# Patient Record
Sex: Male | Born: 1965 | Race: White | Hispanic: No | Smoking: Current every day smoker
Health system: Southern US, Community
[De-identification: ages and names within clinical notes are randomized; demographics above are authoritative.]

## PROBLEM LIST (undated history)

## (undated) DIAGNOSIS — Z8619 Personal history of other infectious and parasitic diseases: Secondary | ICD-10-CM

## (undated) DIAGNOSIS — Z87442 Personal history of urinary calculi: Secondary | ICD-10-CM

## (undated) DIAGNOSIS — J449 Chronic obstructive pulmonary disease, unspecified: Secondary | ICD-10-CM

## (undated) DIAGNOSIS — Z8673 Personal history of transient ischemic attack (TIA), and cerebral infarction without residual deficits: Secondary | ICD-10-CM

## (undated) DIAGNOSIS — N201 Calculus of ureter: Secondary | ICD-10-CM

## (undated) DIAGNOSIS — Z8669 Personal history of other diseases of the nervous system and sense organs: Secondary | ICD-10-CM

## (undated) HISTORY — PX: TOTAL HIP ARTHROPLASTY: SHX124

---

## 2006-09-28 ENCOUNTER — Emergency Department (HOSPITAL_COMMUNITY): Admission: EM | Admit: 2006-09-28 | Discharge: 2006-09-28 | Payer: Self-pay | Admitting: Emergency Medicine

## 2007-08-29 HISTORY — PX: KNEE ARTHROSCOPY: SUR90

## 2010-11-02 ENCOUNTER — Emergency Department (HOSPITAL_COMMUNITY): Payer: Medicaid - Out of State

## 2010-11-02 ENCOUNTER — Emergency Department (HOSPITAL_COMMUNITY)
Admission: EM | Admit: 2010-11-02 | Discharge: 2010-11-03 | Disposition: A | Discharge status: Home / Self Care | Payer: Medicaid - Out of State | Attending: Emergency Medicine | Admitting: Emergency Medicine

## 2010-11-02 DIAGNOSIS — R51 Headache: Secondary | ICD-10-CM | POA: Insufficient documentation

## 2010-11-02 DIAGNOSIS — Z8673 Personal history of transient ischemic attack (TIA), and cerebral infarction without residual deficits: Secondary | ICD-10-CM | POA: Insufficient documentation

## 2010-11-02 DIAGNOSIS — J329 Chronic sinusitis, unspecified: Secondary | ICD-10-CM | POA: Insufficient documentation

## 2010-11-02 DIAGNOSIS — R55 Syncope and collapse: Secondary | ICD-10-CM | POA: Insufficient documentation

## 2010-11-02 LAB — DIFFERENTIAL
Basophils Absolute: 0 10*3/uL (ref 0.0–0.1)
Basophils Relative: 0 % (ref 0–1)
Monocytes Absolute: 0.8 10*3/uL (ref 0.1–1.0)
Monocytes Relative: 6 % (ref 3–12)

## 2010-11-02 LAB — CBC
HCT: 45.1 % (ref 39.0–52.0)
MCHC: 33.5 g/dL (ref 30.0–36.0)
MCV: 89.5 fL (ref 78.0–100.0)
Platelets: 260 10*3/uL (ref 150–400)
RBC: 5.04 MIL/uL (ref 4.22–5.81)
RDW: 12.9 % (ref 11.5–15.5)
WBC: 13.4 10*3/uL — ABNORMAL HIGH (ref 4.0–10.5)

## 2010-11-02 LAB — POCT I-STAT, CHEM 8
BUN: 15 mg/dL (ref 6–23)
Creatinine, Ser: 1.3 mg/dL (ref 0.4–1.5)
Glucose, Bld: 101 mg/dL — ABNORMAL HIGH (ref 70–99)
HCT: 46 % (ref 39.0–52.0)

## 2010-11-03 ENCOUNTER — Encounter (HOSPITAL_COMMUNITY): Payer: Self-pay

## 2016-04-04 ENCOUNTER — Observation Stay (HOSPITAL_COMMUNITY)
Admission: EM | Admit: 2016-04-04 | Discharge: 2016-04-05 | Disposition: A | Discharge status: Home / Self Care | Payer: Medicaid - Out of State | Attending: Urology | Admitting: Urology

## 2016-04-04 ENCOUNTER — Emergency Department (HOSPITAL_COMMUNITY): Payer: Medicaid - Out of State | Admitting: Anesthesiology

## 2016-04-04 ENCOUNTER — Encounter (HOSPITAL_COMMUNITY): Payer: Self-pay | Admitting: Emergency Medicine

## 2016-04-04 ENCOUNTER — Observation Stay (HOSPITAL_COMMUNITY): Payer: Medicaid - Out of State

## 2016-04-04 ENCOUNTER — Emergency Department (HOSPITAL_COMMUNITY): Payer: Medicaid - Out of State

## 2016-04-04 ENCOUNTER — Encounter (HOSPITAL_COMMUNITY)
Admission: EM | Disposition: A | Discharge status: Home / Self Care | Payer: Self-pay | Source: Home / Self Care | Attending: Emergency Medicine

## 2016-04-04 DIAGNOSIS — Z8673 Personal history of transient ischemic attack (TIA), and cerebral infarction without residual deficits: Secondary | ICD-10-CM | POA: Insufficient documentation

## 2016-04-04 DIAGNOSIS — F17210 Nicotine dependence, cigarettes, uncomplicated: Secondary | ICD-10-CM | POA: Insufficient documentation

## 2016-04-04 DIAGNOSIS — N201 Calculus of ureter: Secondary | ICD-10-CM | POA: Diagnosis present

## 2016-04-04 DIAGNOSIS — N132 Hydronephrosis with renal and ureteral calculous obstruction: Secondary | ICD-10-CM | POA: Diagnosis not present

## 2016-04-04 DIAGNOSIS — N390 Urinary tract infection, site not specified: Secondary | ICD-10-CM | POA: Diagnosis not present

## 2016-04-04 DIAGNOSIS — Z96643 Presence of artificial hip joint, bilateral: Secondary | ICD-10-CM | POA: Insufficient documentation

## 2016-04-04 DIAGNOSIS — Z419 Encounter for procedure for purposes other than remedying health state, unspecified: Secondary | ICD-10-CM

## 2016-04-04 HISTORY — PX: CYSTOSCOPY W/ URETERAL STENT PLACEMENT: SHX1429

## 2016-04-04 LAB — CBC WITH DIFFERENTIAL/PLATELET
BASOS ABS: 0 10*3/uL (ref 0.0–0.1)
BASOS PCT: 0 %
EOS ABS: 0.1 10*3/uL (ref 0.0–0.7)
Eosinophils Relative: 1 %
HEMATOCRIT: 44.8 % (ref 39.0–52.0)
HEMOGLOBIN: 15.4 g/dL (ref 13.0–17.0)
Lymphocytes Relative: 20 %
Lymphs Abs: 2.6 10*3/uL (ref 0.7–4.0)
MCH: 30.8 pg (ref 26.0–34.0)
MCHC: 34.4 g/dL (ref 30.0–36.0)
MCV: 89.6 fL (ref 78.0–100.0)
MONOS PCT: 7 %
Monocytes Absolute: 0.9 10*3/uL (ref 0.1–1.0)
NEUTROS ABS: 9.2 10*3/uL — AB (ref 1.7–7.7)
NEUTROS PCT: 72 %
Platelets: 257 10*3/uL (ref 150–400)
RBC: 5 MIL/uL (ref 4.22–5.81)
RDW: 12.8 % (ref 11.5–15.5)
WBC: 12.8 10*3/uL — ABNORMAL HIGH (ref 4.0–10.5)

## 2016-04-04 LAB — URINE MICROSCOPIC-ADD ON

## 2016-04-04 LAB — COMPREHENSIVE METABOLIC PANEL
ALK PHOS: 56 U/L (ref 38–126)
ALT: 27 U/L (ref 17–63)
ANION GAP: 8 (ref 5–15)
AST: 23 U/L (ref 15–41)
Albumin: 4 g/dL (ref 3.5–5.0)
BILIRUBIN TOTAL: 0.4 mg/dL (ref 0.3–1.2)
BUN: 9 mg/dL (ref 6–20)
CALCIUM: 9.2 mg/dL (ref 8.9–10.3)
CO2: 24 mmol/L (ref 22–32)
Chloride: 108 mmol/L (ref 101–111)
Creatinine, Ser: 1 mg/dL (ref 0.61–1.24)
GFR calc non Af Amer: >60 mL/min (ref >60)
Glucose, Bld: 103 mg/dL — ABNORMAL HIGH (ref 65–99)
Potassium: 4 mmol/L (ref 3.5–5.1)
SODIUM: 140 mmol/L (ref 135–145)
TOTAL PROTEIN: 7.1 g/dL (ref 6.5–8.1)

## 2016-04-04 LAB — URINALYSIS, ROUTINE W REFLEX MICROSCOPIC
Glucose, UA: NEGATIVE mg/dL
KETONES UR: 15 mg/dL — AB
NITRITE: POSITIVE — AB
PROTEIN: 100 mg/dL — AB
Specific Gravity, Urine: 1.028 (ref 1.005–1.030)
pH: 5 (ref 5.0–8.0)

## 2016-04-04 LAB — I-STAT CHEM 8, ED
BUN: 11 mg/dL (ref 6–20)
CALCIUM ION: 1.17 mmol/L (ref 1.13–1.30)
Chloride: 105 mmol/L (ref 101–111)
Creatinine, Ser: 1 mg/dL (ref 0.61–1.24)
GLUCOSE: 97 mg/dL (ref 65–99)
HCT: 45 % (ref 39.0–52.0)
HEMOGLOBIN: 15.3 g/dL (ref 13.0–17.0)
Potassium: 4 mmol/L (ref 3.5–5.1)
Sodium: 143 mmol/L (ref 135–145)
TCO2: 25 mmol/L (ref 0–100)

## 2016-04-04 LAB — LIPASE, BLOOD: Lipase: 42 U/L (ref 11–51)

## 2016-04-04 LAB — I-STAT TROPONIN, ED: TROPONIN I, POC: 0 ng/mL (ref 0.00–0.08)

## 2016-04-04 SURGERY — CYSTOSCOPY, WITH RETROGRADE PYELOGRAM AND URETERAL STENT INSERTION
Anesthesia: General | Site: Ureter | Laterality: Right

## 2016-04-04 MED ORDER — SODIUM CHLORIDE 0.9 % IV SOLN
Freq: Once | INTRAVENOUS | Status: AC
  Administered 2016-04-04: 14:00:00 via INTRAVENOUS

## 2016-04-04 MED ORDER — PROPOFOL 10 MG/ML IV BOLUS
INTRAVENOUS | Status: AC
  Filled 2016-04-04: qty 20

## 2016-04-04 MED ORDER — HYDROMORPHONE HCL 1 MG/ML IJ SOLN
1.0000 mg | Freq: Once | INTRAMUSCULAR | Status: AC
  Administered 2016-04-04: 1 mg via INTRAVENOUS
  Filled 2016-04-04: qty 1

## 2016-04-04 MED ORDER — LIDOCAINE HCL (CARDIAC) 20 MG/ML IV SOLN
INTRAVENOUS | Status: DC | PRN
  Administered 2016-04-04: 40 mg via INTRAVENOUS

## 2016-04-04 MED ORDER — SODIUM CHLORIDE 0.9 % IR SOLN
Status: DC | PRN
  Administered 2016-04-04: 4000 mL

## 2016-04-04 MED ORDER — PROMETHAZINE HCL 25 MG/ML IJ SOLN
6.2500 mg | INTRAMUSCULAR | Status: DC | PRN
Start: 2016-04-04 — End: 2016-04-04

## 2016-04-04 MED ORDER — DEXTROSE 5 % IV SOLN
1.0000 g | INTRAVENOUS | Status: DC
  Filled 2016-04-04: qty 10

## 2016-04-04 MED ORDER — MIDAZOLAM HCL 2 MG/2ML IJ SOLN
INTRAMUSCULAR | Status: DC | PRN
Start: 2016-04-04 — End: 2016-04-04
  Administered 2016-04-04: 1 mg via INTRAVENOUS

## 2016-04-04 MED ORDER — ONDANSETRON HCL 4 MG/2ML IJ SOLN
4.0000 mg | Freq: Once | INTRAMUSCULAR | Status: AC
  Administered 2016-04-04: 4 mg via INTRAVENOUS
  Filled 2016-04-04: qty 2

## 2016-04-04 MED ORDER — SODIUM CHLORIDE 0.9 % IV SOLN
INTRAVENOUS | Status: DC
  Administered 2016-04-04: 21:00:00 via INTRAVENOUS

## 2016-04-04 MED ORDER — FENTANYL CITRATE (PF) 250 MCG/5ML IJ SOLN
INTRAMUSCULAR | Status: DC | PRN
  Administered 2016-04-04: 25 ug via INTRAVENOUS

## 2016-04-04 MED ORDER — SENNA 8.6 MG PO TABS
1.0000 | ORAL_TABLET | Freq: Two times a day (BID) | ORAL | Status: DC
  Administered 2016-04-04 – 2016-04-05 (×2): 8.6 mg via ORAL
  Filled 2016-04-04 (×2): qty 1

## 2016-04-04 MED ORDER — DEXTROSE 5 % IV SOLN
1.0000 g | Freq: Once | INTRAVENOUS | Status: AC
  Administered 2016-04-04: 1 g via INTRAVENOUS
  Filled 2016-04-04: qty 10

## 2016-04-04 MED ORDER — LORATADINE 10 MG PO TABS
10.0000 mg | ORAL_TABLET | Freq: Every day | ORAL | Status: DC
  Administered 2016-04-05: 10 mg via ORAL
  Filled 2016-04-04: qty 1

## 2016-04-04 MED ORDER — HYDROMORPHONE HCL 1 MG/ML IJ SOLN
0.5000 mg | INTRAMUSCULAR | Status: DC | PRN
  Administered 2016-04-05 (×3): 1 mg via INTRAVENOUS
  Filled 2016-04-04 (×3): qty 1

## 2016-04-04 MED ORDER — FENTANYL CITRATE (PF) 100 MCG/2ML IJ SOLN: 25.0000 ug | INTRAMUSCULAR | Status: DC | PRN

## 2016-04-04 MED ORDER — MIDAZOLAM HCL 2 MG/2ML IJ SOLN
INTRAMUSCULAR | Status: AC
  Filled 2016-04-04: qty 2

## 2016-04-04 MED ORDER — OXYCODONE-ACETAMINOPHEN 5-325 MG PO TABS
1.0000 | ORAL_TABLET | ORAL | Status: DC | PRN
  Administered 2016-04-04: 2 via ORAL
  Filled 2016-04-04: qty 2

## 2016-04-04 MED ORDER — CEFTRIAXONE SODIUM 1 G IJ SOLR
1.0000 g | Freq: Once | INTRAMUSCULAR | Status: AC
  Administered 2016-04-04: 1 g via INTRAVENOUS
  Filled 2016-04-04: qty 10

## 2016-04-04 MED ORDER — ONDANSETRON HCL 4 MG/2ML IJ SOLN
INTRAMUSCULAR | Status: DC | PRN
  Administered 2016-04-04: 4 mg via INTRAVENOUS

## 2016-04-04 MED ORDER — PROPOFOL 10 MG/ML IV BOLUS
INTRAVENOUS | Status: DC | PRN
  Administered 2016-04-04: 180 mg via INTRAVENOUS

## 2016-04-04 MED ORDER — FENTANYL CITRATE (PF) 250 MCG/5ML IJ SOLN
INTRAMUSCULAR | Status: AC
  Filled 2016-04-04: qty 5

## 2016-04-04 MED ORDER — IOPAMIDOL (ISOVUE-300) INJECTION 61%
INTRAVENOUS | Status: AC
  Administered 2016-04-04: 100 mL
  Filled 2016-04-04: qty 100

## 2016-04-04 MED ORDER — DIATRIZOATE MEGLUMINE 30 % UR SOLN
URETHRAL | Status: DC | PRN
  Administered 2016-04-04: 3 mL via URETHRAL

## 2016-04-04 MED ORDER — 0.9 % SODIUM CHLORIDE (POUR BTL) OPTIME
TOPICAL | Status: DC | PRN
  Administered 2016-04-04: 1000 mL

## 2016-04-04 MED ORDER — LACTATED RINGERS IV SOLN
INTRAVENOUS | Status: DC | PRN
  Administered 2016-04-04: 19:00:00 via INTRAVENOUS

## 2016-04-04 SURGICAL SUPPLY — 13 items
BAG URO CATCHER STRL LF (MISCELLANEOUS) ×2 IMPLANT
BASKET ZERO TIP NITINOL 2.4FR (BASKET) IMPLANT
BSKT STON RTRVL ZERO TP 2.4FR (BASKET)
CATH INTERMIT  6FR 70CM (CATHETERS) ×1 IMPLANT
CLOTH BEACON ORANGE TIMEOUT ST (SAFETY) ×2 IMPLANT
GLOVE BIOGEL M STRL SZ7.5 (GLOVE) ×2 IMPLANT
GOWN STRL REUS W/TWL LRG LVL3 (GOWN DISPOSABLE) ×4 IMPLANT
GUIDEWIRE ANG ZIPWIRE 038X150 (WIRE) IMPLANT
GUIDEWIRE STR DUAL SENSOR (WIRE) ×2 IMPLANT
MANIFOLD NEPTUNE II (INSTRUMENTS) ×2 IMPLANT
PACK CYSTO (CUSTOM PROCEDURE TRAY) ×2 IMPLANT
STENT POLARIS 5FRX26 (STENTS) ×1 IMPLANT
TUBING CONNECTING 10 (TUBING) ×2 IMPLANT

## 2016-04-04 NOTE — Transfer of Care (Signed)
Immediate Anesthesia Transfer of Care Note  Patient: Tyrone Harris  Procedure(s) Performed: Procedure(s): CYSTOSCOPY WITH RETROGRADE PYELOGRAM/URETERAL STENT PLACEMENT (Right)  Patient Location: PACU  Anesthesia Type:General  Level of Consciousness: awake  Airway & Oxygen Therapy: Patient Spontanous Breathing and Patient connected to face mask oxygen  Post-op Assessment: Report given to RN and Post -op Vital signs reviewed and stable  Post vital signs: Reviewed and stable  Last Vitals:  Vitals:   04/04/16 1435 04/04/16 1536  BP: 118/84 125/82  Pulse: 95 76  Resp: 18 21  Temp: 36.5 C 36.7 C    Last Pain:  Vitals:   04/04/16 1726  TempSrc:   PainSc: 5          Complications: No apparent anesthesia complications

## 2016-04-04 NOTE — ED Triage Notes (Signed)
EMS - Patient coming from home with c/o of being pale, diaphoretic, left arm pain x 2 weeks, epigastric pain (ongoing since 11:10am), nausea and dizzy.  Patient given  Aspirin and 0.4mg  Nitro.  Per wife at bedside, patient was sitting on the toilet and had multiple episodes (4-5) of blacking out while sitting on the toilet.  BP 112/78, 81 HR, 94% Room Air.

## 2016-04-04 NOTE — Progress Notes (Signed)
PHARMACY NOTE -  ANTIBIOTIC RENAL DOSE ADJUSTMENT    Request received for Pharmacy to assist with antibiotic renal dose adjustment.   Patient has been initiated on Rocephin for ureteral stent placement in setting of bacteriuria.  Current dosage is appropriate and no renal adjustment is required for Rocephin  Will sign off at this time.  Please reconsult if a change in clinical status warrants re-evaluation of dosage.   Bernadene Personrew Katalaya Beel, PharmD, BCPS Pager: [phone] 04/04/2016, 8:57 PM

## 2016-04-04 NOTE — Progress Notes (Signed)
ED CM noted pt with no pcp and medicaid of [place]  ED CM spoke pt who confirms his wife and children live her in [place] but he continues to live in [place]. States he is is "disabled" and thought he had medicare but it is not listed in EPIC. Cm discussed that [place] in [place] and if he continues to stay in  he may consider [facility]    CM discussed and provided written information to assist pt with determining choice for uninsured accepting pcps, discussed the importance of pcp vs EDP services for f/u care, www.needymeds.org, www.goodrx.com, discounted pharmacies and other [place] resources such as [facility] , [facility], affordable care act, financial assistance, uninsured dental services,  med assist, DSS and  health department  Reviewed resources for [place] uninsured accepting pcps like [facility], family medicine [facility] [address], [facility], [facility], local urgent care centers, [facility], [facility], general medical clinics, family services of the [place], [facility] urgent care plus others, medication resources, [facility] out patient pharmacies and housing Pt voiced understanding and appreciation of resources provided   Provided [facility] contact information Pt is not eligible for [facility] because he is not a resident of [place] per pt   1728 asked ED registration to check to see if pt has medicare coverage

## 2016-04-04 NOTE — ED Provider Notes (Signed)
Seen by Dr. Berneice HeinrichManny who will admit patient to hospital.   Tyrone Nayobert Layana Konkel, MD 04/04/16 1710

## 2016-04-04 NOTE — ED Provider Notes (Signed)
[facility]-EMERGENCY DEPT Provider Note   CSN: [id number] Arrival date & time: 04/04/16  1144  First Provider Contact:  First MD Initiated Contact with Patient 04/04/16 1236        History   Chief Complaint Chief Complaint  Patient presents with  . Dizziness  . Arm Pain    left  . Abdominal Pain  . Nausea    HPI Tyrone Harris is a 50 y.o. male.  HPI Patient reports severe worsening of his right-sided abdominal pain which began today.  He's had some hematuria through the weekend and believes he may pass a kidney stone a month ago but his pain is significantly now worsened.  He denies fevers or chills.  He does report nausea and vomiting.  He had a near syncopal episode while at home given the severity of the pain for which EMS was contacted.  He denies chest pain shortness of breath.  He does report some intermittent left shoulder pain over the past several weeks without associated chest discomfort or pain.  His pain is severe in severity at this time and located to his right abdomen.  He denies flank pain at this time.    Past Medical History:  Diagnosis Date  . Bell's palsy   . Stroke [facility]) 2003    There are no active problems to display for this patient.   Past Surgical History:  Procedure Laterality Date  . KNEE SURGERY Right   . TOTAL HIP ARTHROPLASTY Bilateral        Home Medications    Prior to Admission medications   Not on File    Family History No family history on file.  Social History Social History  Substance Use Topics  . Smoking status: Current Every Day Smoker    Packs/day: 2.00    Types: Cigarettes  . Smokeless tobacco: Never Used  . Alcohol use No     Allergies   Review of patient's allergies indicates no known allergies.   Review of Systems Review of Systems  All other systems reviewed and are negative.    Physical Exam Updated Vital Signs BP 122/89   Pulse 65   Temp 97.8 F (36.6 C) (Oral)   Resp 12   Ht  (1.778 m)    Wt 220 lb (99.8 kg)   SpO2 96%   BMI 31.57 kg/m   Physical Exam  Constitutional: He is oriented to person, place, and time. He appears well-developed and well-nourished.  HENT:  Head: Normocephalic and atraumatic.  Eyes: EOM are normal.  Neck: Normal range of motion.  Cardiovascular: Normal rate, regular rhythm, normal heart sounds and intact distal pulses.   Pulmonary/Chest: Effort normal and breath sounds normal. No respiratory distress.  Abdominal: Soft. He exhibits no distension. There is no tenderness.  Musculoskeletal: Normal range of motion.  Neurological: He is alert and oriented to person, place, and time.  Skin: Skin is warm and dry.  Psychiatric: He has a normal mood and affect. Judgment normal.  Nursing note and vitals reviewed.    ED Treatments / Results  Labs (all labs ordered are listed, but only abnormal results are displayed) Labs Reviewed  CBC WITH DIFFERENTIAL/PLATELET - Abnormal; Notable for the following:       Result Value   WBC 12.8 (*)    Neutro Abs 9.2 (*)    All other components within normal limits  COMPREHENSIVE METABOLIC PANEL - Abnormal; Notable for the following:    Glucose, Bld 103 (*)  All other components within normal limits  URINALYSIS, ROUTINE W REFLEX MICROSCOPIC (NOT AT [facility]) - Abnormal; Notable for the following:    Color, Urine BROWN (*)    APPearance TURBID (*)    Hgb urine dipstick LARGE (*)    Bilirubin Urine LARGE (*)    Ketones, ur 15 (*)    Protein, ur 100 (*)    Nitrite POSITIVE (*)    Leukocytes, UA MODERATE (*)    All other components within normal limits  URINE MICROSCOPIC-ADD ON - Abnormal; Notable for the following:    Squamous Epithelial / LPF 0-5 (*)    Bacteria, UA FEW (*)    All other components within normal limits  URINE CULTURE  LIPASE, BLOOD  I-STAT TROPOININ, ED  I-STAT CHEM 8, ED    EKG  EKG Interpretation  Date/Time:  Tuesday April 04 2016 11:51:20 EDT Ventricular Rate:  74 PR Interval:      QRS Duration: 91 QT Interval:  375 QTC Calculation: 416 R Axis:   78 Text Interpretation:  Sinus rhythm RSR' in V1 or V2, right VCD or RVH No significant change was found Confirmed by Jamila Slatten  MD, Alecia Doi ([id number]) on 04/04/2016 2:41:23 PM       Radiology Ct Abdomen Pelvis W Contrast  Result Date: 04/04/2016 CLINICAL DATA:  Dark urine today. Chest pain and blurry vision with dizziness and abdominal pain on the right side. Testicular pain. EXAM: CT ABDOMEN AND PELVIS WITH CONTRAST TECHNIQUE: Multidetector CT imaging of the abdomen and pelvis was performed using the standard protocol following bolus administration of intravenous contrast. CONTRAST:  ISOVUE-300 IOPAMIDOL (ISOVUE-300) INJECTION 61% COMPARISON:  Chest radiograph of 04/04/2016 FINDINGS: Lower chest: Dependent subsegmental atelectasis in both lower lobes. Hepatobiliary: Diffuse hepatic steatosis.  Gallbladder unremarkable. Pancreas: Unremarkable Spleen: Unremarkable Adrenals/Urinary Tract: 1.2 by 1.5 cm mass of the medial limb left adrenal gland, relative washout 88%, compatible with adrenal adenoma. Mild left hydronephrosis due to a 2 mm left proximal ureteral calculus about at the vertical level of the inferior margin of the kidney, shown on image 81/5. Delayed excretion of the right kidney results. No ureteral dilatation distal to this point. No other stones identified. Distal ureters and bladder obscured by streak artifact from the bilateral hip implants. Stomach/Bowel: Scattered diverticula of the descending colon with mild proximal sigmoid diverticulosis. No active diverticulitis. Vascular/Lymphatic: Aortoiliac atherosclerotic vascular disease. Mildly enlarged peripancreatic lymph node, 1.2 cm in short axis on image 23/2. Reproductive: Prostate gland region obscured by streak artifact from the hip implants. Other: No supplemental non-categorized findings. Musculoskeletal: Bilateral total hip prostheses. 3.2 cm umbilical hernia containing  adipose tissue. Remote superior endplate compression at the L3 vertebral level. Generally mild degenerative disc disease in the lumbar spine. IMPRESSION: 1. Obstructive 2 mm right proximal ureteral calculus with associated mild hydronephrosis and associated delayed excretion of contrast from the right kidney. No other stones observed. 2. Diffuse hepatic steatosis. 3. Small left adrenal adenoma. 4. Mildly enlarged peripancreatic node lymph node at 1.2 cm in diameter, technically nonspecific, although in the absence of other abnormalities this is statistically likely to be benign/reactive. 5. Proximal sigmoid colon diverticulosis. 6. Small umbilical hernia contains adipose tissue. Electronically Signed   By: Gaylyn Rong M.D.   On: 04/04/2016 13:51   Dg Chest Portable 1 View  Result Date: 04/04/2016 CLINICAL DATA:  Upper abdominal pain EXAM: PORTABLE CHEST 1 VIEW COMPARISON:  None. FINDINGS: EKG leads create artifact over the chest. Normal heart size and mediastinal contours. No acute  infiltrate or edema. No effusion or pneumothorax. No acute osseous findings. No evidence of pneumoperitoneum. IMPRESSION: Negative portable chest. Electronically Signed   By: Marnee SpringJonathon  Watts M.D.   On: 04/04/2016 12:57    Procedures Procedures (including critical care time)  Medications Ordered in ED Medications  HYDROmorphone (DILAUDID) injection 1 mg (1 mg Intravenous Given 04/04/16 1243)  ondansetron (ZOFRAN) injection 4 mg (4 mg Intravenous Given 04/04/16 1243)  iopamidol (ISOVUE-300) 61 % injection (100 mLs  Contrast Given 04/04/16 1322)     Initial Impression / Assessment and Plan / ED Course  I have reviewed the triage vital signs and the nursing notes.  Pertinent labs & imaging results that were available during my care of the patient were reviewed by me and considered in my medical decision making (see chart for details).  Clinical Course    2:41 PM I spoke with Dr. Berneice HeinrichManny of urology who [facility] as the patient needs a stent for his right ureteral stone with associated pyelonephritis.  Urine culture sent.  IV Rocephin now.  Patient's pain is improved.  [facility]. Will remain NPO  Final Clinical Impressions(s) / ED Diagnoses   Final diagnoses:  Right ureteral stone  UTI (lower urinary tract infection)    New Prescriptions New Prescriptions   No medications on file     Azalia BilisKevin Londin Antone, MD 04/04/16 1443

## 2016-04-04 NOTE — Anesthesia Preprocedure Evaluation (Signed)
Anesthesia Evaluation  Patient identified by MRN, date of birth, ID band Patient awake    Reviewed: Allergy & Precautions, NPO status , Patient's Chart, lab work & pertinent test results  Airway Mallampati: II  TM Distance: >3 FB Neck ROM: Full    Dental  (+) Dental Advisory Given   Pulmonary Current Smoker,    breath sounds clear to auscultation       Cardiovascular negative cardio ROS   Rhythm:Regular Rate:Normal     Neuro/Psych CVA    GI/Hepatic negative GI ROS, Neg liver ROS,   Endo/Other  negative endocrine ROS  Renal/GU negative Renal ROS     Musculoskeletal   Abdominal   Peds  Hematology negative hematology ROS (+)   Anesthesia Other Findings   Reproductive/Obstetrics                             Lab Results  Component Value Date   WBC 12.8 (H) 04/04/2016   HGB 15.3 04/04/2016   HCT 45.0 04/04/2016   MCV 89.6 04/04/2016   PLT 257 04/04/2016   Lab Results  Component Value Date   CREATININE 1.00 04/04/2016   BUN 11 04/04/2016   NA 143 04/04/2016   K 4.0 04/04/2016   CL 105 04/04/2016   CO2 24 04/04/2016    Anesthesia Physical Anesthesia Plan  ASA: II  Anesthesia Plan: General   Post-op Pain Management:    Induction: Intravenous  Airway Management Planned: LMA  Additional Equipment:   Intra-op Plan:   Post-operative Plan: Extubation in OR  Informed Consent: I have reviewed the patients History and Physical, chart, labs and discussed the procedure including the risks, benefits and alternatives for the proposed anesthesia with the patient or authorized representative who has indicated his/her understanding and acceptance.   Dental advisory given  Plan Discussed with: CRNA  Anesthesia Plan Comments:         Anesthesia Quick Evaluation

## 2016-04-04 NOTE — H&P (Signed)
Tyrone Harris is an 50 y.o. male.    Chief Complaint:  Right Ureteral Stone with Refractory Colic and Bacteruria   HPI:   Right Ureteral Stone with Refractory Colic and Bacteruria - 70m Rt prox ureteral stone by ER CT on eval severe abd pain and malaise / diaphoresis. Stone is solitary and just below UPJ on right. No fevers or leukocytosis but severe malaise and bacteruria / + nitrites.   PMH sig for bilateral hip replacement, knee surgery, remote CVA (minimal deficits), no limiting CV disease, no blood thinners.   Today "DKasandra Knudsen" is seen in consultation for above.   Past Medical History:  Diagnosis Date  . Bell's palsy   . Stroke ([facility] 2003    Past Surgical History:  Procedure Laterality Date  . KNEE SURGERY Right   . TOTAL HIP ARTHROPLASTY Bilateral     No family history on file. Social History:  reports that he has been smoking Cigarettes.  He has been smoking about 2.00 packs per day. He has never used smokeless tobacco. He reports that he uses drugs, including Marijuana, about 1 time per week. He reports that he does not drink alcohol.  Allergies: No Known Allergies   (Not in a hospital admission)  Results for orders placed or performed during the hospital encounter of 04/04/16 (from the past 48 hour(s))  CBC with Differential/Platelet     Status: Abnormal   Collection Time: 04/04/16 12:32 PM  Result Value Ref Range   WBC 12.8 (H) 4.0 - 10.5 K/uL   RBC 5.00 4.22 - 5.81 MIL/uL   Hemoglobin 15.4 13.0 - 17.0 g/dL   HCT 44.8 39.0 - 52.0 %   MCV 89.6 78.0 - 100.0 fL   MCH 30.8 26.0 - 34.0 pg   MCHC 34.4 30.0 - 36.0 g/dL   RDW 12.8 11.5 - 15.5 %   Platelets 257 150 - 400 K/uL   Neutrophils Relative % 72 %   Neutro Abs 9.2 (H) 1.7 - 7.7 K/uL   Lymphocytes Relative 20 %   Lymphs Abs 2.6 0.7 - 4.0 K/uL   Monocytes Relative 7 %   Monocytes Absolute 0.9 0.1 - 1.0 K/uL   Eosinophils Relative 1 %   Eosinophils Absolute 0.1 0.0 - 0.7 K/uL   Basophils Relative 0 %    Basophils Absolute 0.0 0.0 - 0.1 K/uL  Comprehensive metabolic panel     Status: Abnormal   Collection Time: 04/04/16 12:32 PM  Result Value Ref Range   Sodium 140 135 - 145 mmol/L   Potassium 4.0 3.5 - 5.1 mmol/L   Chloride 108 101 - 111 mmol/L   CO2 24 22 - 32 mmol/L   Glucose, Bld 103 (H) 65 - 99 mg/dL   BUN 9 6 - 20 mg/dL   Creatinine, Ser 1.00 0.61 - 1.24 mg/dL   Calcium 9.2 8.9 - 10.3 mg/dL   Total Protein 7.1 6.5 - 8.1 g/dL   Albumin 4.0 3.5 - 5.0 g/dL   AST 23 15 - 41 U/L   ALT 27 17 - 63 U/L   Alkaline Phosphatase 56 38 - 126 U/L   Total Bilirubin 0.4 0.3 - 1.2 mg/dL   GFR calc non Af Amer >60 >60 mL/min   GFR calc Af Amer >60 >60 mL/min    Comment: (NOTE) The eGFR has been calculated using the CKD EPI equation. This calculation has not been validated in all clinical situations. eGFR's persistently <60 mL/min signify possible Chronic Kidney Disease.  Anion gap 8 5 - 15  Lipase, blood     Status: None   Collection Time: 04/04/16 12:32 PM  Result Value Ref Range   Lipase 42 11 - 51 U/L  Urinalysis, Routine w reflex microscopic (not at [facility])     Status: Abnormal   Collection Time: 04/04/16 12:33 PM  Result Value Ref Range   Color, Urine BROWN (A) YELLOW    Comment: BIOCHEMICALS MAY BE AFFECTED BY COLOR   APPearance TURBID (A) CLEAR   Specific Gravity, Urine 1.028 1.005 - 1.030   pH 5.0 5.0 - 8.0   Glucose, UA NEGATIVE NEGATIVE mg/dL   Hgb urine dipstick LARGE (A) NEGATIVE   Bilirubin Urine LARGE (A) NEGATIVE   Ketones, ur 15 (A) NEGATIVE mg/dL   Protein, ur 100 (A) NEGATIVE mg/dL   Nitrite POSITIVE (A) NEGATIVE   Leukocytes, UA MODERATE (A) NEGATIVE  Urine microscopic-add on     Status: Abnormal   Collection Time: 04/04/16 12:33 PM  Result Value Ref Range   Squamous Epithelial / LPF 0-5 (A) NONE SEEN   WBC, UA 6-30 0 - 5 WBC/hpf   RBC / HPF TOO NUMEROUS TO COUNT 0 - 5 RBC/hpf   Bacteria, UA FEW (A) NONE SEEN   Urine-Other MUCOUS PRESENT   I-stat troponin,  ED     Status: None   Collection Time: 04/04/16 12:44 PM  Result Value Ref Range   Troponin i, poc 0.00 0.00 - 0.08 ng/mL   Comment 3            Comment: Due to the release kinetics of cTnI, a negative result within the first hours of the onset of symptoms does not rule out myocardial infarction with certainty. If myocardial infarction is still suspected, repeat the test at appropriate intervals.   I-stat chem 8, ed     Status: None   Collection Time: 04/04/16 12:46 PM  Result Value Ref Range   Sodium 143 135 - 145 mmol/L   Potassium 4.0 3.5 - 5.1 mmol/L   Chloride 105 101 - 111 mmol/L   BUN 11 6 - 20 mg/dL   Creatinine, Ser 1.00 0.61 - 1.24 mg/dL   Glucose, Bld 97 65 - 99 mg/dL   Calcium, Ion 1.17 1.13 - 1.30 mmol/L   TCO2 25 0 - 100 mmol/L   Hemoglobin 15.3 13.0 - 17.0 g/dL   HCT 45.0 39.0 - 52.0 %   Ct Abdomen Pelvis W Contrast  Result Date: 04/04/2016 CLINICAL DATA:  Dark urine today. Chest pain and blurry vision with dizziness and abdominal pain on the right side. Testicular pain. EXAM: CT ABDOMEN AND PELVIS WITH CONTRAST TECHNIQUE: Multidetector CT imaging of the abdomen and pelvis was performed using the standard protocol following bolus administration of intravenous contrast. CONTRAST:  129m ISOVUE-300 IOPAMIDOL (ISOVUE-300) INJECTION 61% COMPARISON:  Chest radiograph of 04/04/2016 FINDINGS: Lower chest: Dependent subsegmental atelectasis in both lower lobes. Hepatobiliary: Diffuse hepatic steatosis.  Gallbladder unremarkable. Pancreas: Unremarkable Spleen: Unremarkable Adrenals/Urinary Tract: 1.2 by 1.5 cm mass of the medial limb left adrenal gland, relative washout 88%, compatible with adrenal adenoma. Mild left hydronephrosis due to a 2 mm left proximal ureteral calculus about at the vertical level of the inferior margin of the kidney, shown on image 81/5. Delayed excretion of the right kidney results. No ureteral dilatation distal to this point. No other stones identified.  Distal ureters and bladder obscured by streak artifact from the bilateral hip implants. Stomach/Bowel: Scattered diverticula of the descending colon with  mild proximal sigmoid diverticulosis. No active diverticulitis. Vascular/Lymphatic: Aortoiliac atherosclerotic vascular disease. Mildly enlarged peripancreatic lymph node, 1.2 cm in short axis on image 23/2. Reproductive: Prostate gland region obscured by streak artifact from the hip implants. Other: No supplemental non-categorized findings. Musculoskeletal: Bilateral total hip prostheses. 3.2 cm umbilical hernia containing adipose tissue. Remote superior endplate compression at the L3 vertebral level. Generally mild degenerative disc disease in the lumbar spine. IMPRESSION: 1. Obstructive 2 mm right proximal ureteral calculus with associated mild hydronephrosis and associated delayed excretion of contrast from the right kidney. No other stones observed. 2. Diffuse hepatic steatosis. 3. Small left adrenal adenoma. 4. Mildly enlarged peripancreatic node lymph node at 1.2 cm in diameter, technically nonspecific, although in the absence of other abnormalities this is statistically likely to be benign/reactive. 5. Proximal sigmoid colon diverticulosis. 6. Small umbilical hernia contains adipose tissue. Electronically Signed   By: Van Clines M.D.   On: 04/04/2016 13:51   Dg Chest Portable 1 View  Result Date: 04/04/2016 CLINICAL DATA:  Upper abdominal pain EXAM: PORTABLE CHEST 1 VIEW COMPARISON:  None. FINDINGS: EKG leads create artifact over the chest. Normal heart size and mediastinal contours. No acute infiltrate or edema. No effusion or pneumothorax. No acute osseous findings. No evidence of pneumoperitoneum. IMPRESSION: Negative portable chest. Electronically Signed   By: Monte Fantasia M.D.   On: 04/04/2016 12:57    Review of Systems  Constitutional: Positive for malaise/fatigue. Negative for fever.  HENT: Negative.   Eyes: Negative.    Respiratory: Negative.   Cardiovascular: Negative.   Gastrointestinal: Negative.   Genitourinary: Negative.   Musculoskeletal: Negative.   Skin: Negative.   Endo/Heme/Allergies: Negative.   Psychiatric/Behavioral: Negative.     Blood pressure 122/89, pulse 65, temperature 97.8 F (36.6 C), temperature source Oral, resp. rate 12, height 5' 10"  (1.778 m), weight 99.8 kg (220 lb), SpO2 96 %. Physical Exam  Constitutional: He is oriented to person, place, and time. He appears well-developed and well-nourished.  Family at bedside  HENT:  Head: Normocephalic.  Eyes: Pupils are equal, round, and reactive to light.  Neck: Normal range of motion.  Cardiovascular: Normal rate.   Respiratory: Effort normal.  GI: Soft.  Genitourinary:  Genitourinary Comments: Mild Rt CVAT  Musculoskeletal: Normal range of motion.  Neurological: He is alert and oriented to person, place, and time.  Skin: Skin is warm.  Psychiatric: He has a normal mood and affect. His behavior is normal. Judgment and thought content normal.     Assessment/Plan  Right Ureteral Stone with Refractory Colic and Bacteruria - discussed risk of impending infected / obsructed system with significant risk of sepsis / SIRS and goal of renal decompression, then clearance of infectious parameters, and then definitive stone management in elective setting.   IN terms of renal decompression, stent would be first choice. Risks, benefits, alternatives (neph tube) discussed, and peri-op course. He wants to proceed today. Will likely observe overnight post-op to r/o progression of infectious parameters.   Alexis Frock, MD 04/04/2016, 2:36 PM

## 2016-04-04 NOTE — Anesthesia Procedure Notes (Signed)
Procedure Name: LMA Insertion Date/Time: 04/04/2016 7:24 PM Performed by: Minerva EndsMIRARCHI, Maeley Matton M Pre-anesthesia Checklist: Patient identified, Emergency Drugs available, Suction available and Patient being monitored Patient Re-evaluated:Patient Re-evaluated prior to inductionOxygen Delivery Method: Circle System Utilized Preoxygenation: Pre-oxygenation with 100% oxygen Intubation Type: IV induction Ventilation: Mask ventilation without difficulty LMA: LMA inserted LMA Size: 4.0 Number of attempts: 1 Placement Confirmation: positive ETCO2,  breath sounds checked- equal and bilateral and CO2 detector Tube secured with: Tape Dental Injury: Teeth and Oropharynx as per pre-operative assessment  Comments: Smooth IV induction--- LMA AM CRNA atraumatic--- teeth and mouth as preop

## 2016-04-04 NOTE — Brief Op Note (Signed)
04/04/2016  7:36 PM  PATIENT:  Tyrone Harris  50 y.o. male  PRE-OPERATIVE DIAGNOSIS:  right ureteral obstruction  POST-OPERATIVE DIAGNOSIS:  right ureteral obstruction  PROCEDURE:  Procedure(s): CYSTOSCOPY WITH RETROGRADE PYELOGRAM/URETERAL STENT PLACEMENT (Right)  SURGEON:  Surgeon(s) and Role:    * Sebastian Acheheodore Viyan Rosamond, MD - Primary  PHYSICIAN ASSISTANT:   ASSISTANTS: none   ANESTHESIA:   general  EBL:  No intake/output data recorded.  BLOOD ADMINISTERED:none  DRAINS: none   LOCAL MEDICATIONS USED:  NONE  SPECIMEN:  No Specimen  DISPOSITION OF SPECIMEN:  N/A  COUNTS:  YES  TOURNIQUET:  * No tourniquets in log *  DICTATION: .Other Dictation: Dictation Number [id number]  PLAN OF CARE: Admit for overnight observation  PATIENT DISPOSITION:  PACU - hemodynamically stable.   Delay start of Pharmacological VTE agent (>24hrs) due to surgical blood loss or risk of bleeding: yes

## 2016-04-04 NOTE — Op Note (Signed)
NAMFelix Harris:  Rundquist, Rene                ACCOUNT NO.:  [id number]  MEDICAL RECORD NO.:  [id number]  LOCATION:  1412                         [facility]  PHYSICIAN:  Sebastian Acheheodore Takeyah Wieman, MD     DATE OF BIRTH:  1966-02-21  DATE OF PROCEDURE: 04/04/2016                              OPERATIVE REPORT  DIAGNOSIS:  Right ureteral stone, significant bacteriuria, mild hydronephrosis.  PROCEDURE: 1. Cystoscopy with right retrograde pyelogram and interpretation. 2. Right ureteral stent placement 5 x 26 Polaris, no tether.  ESTIMATED BLOOD LOSS:  Nil.  COMPLICATION:  None.  SPECIMEN:  None.  FINDINGS: 1. Unremarkable urinary bladder. 2. Very mild hydronephrosis to filling defect in the proximal ureter     consistent with known stone. 3. Successful placement of right ureteral stent, proximal in renal     pelvis, distal in urinary bladder.  INDICATION:  Mr. Tyrone Harris is a pleasant 50 year old gentleman with history of recurrent nephrolithiasis.  He was found on workup of colicky flank pain to have a relatively small right proximal ureteral stone.  However, his symptoms were quite severe.  He also had malaise, diaphoresis, and bacteriuria.  This clinical picture is certainly concerning for impending obstructing urosepsis.  Options were discussed for management including placement of stent with goal of the renal decompression, clearance of infectious parameters for delayed definitive stone treatment, he wished to proceed.  Informed consent was obtained and placed in the medical record.  PROCEDURE IN DETAIL:  The patient being Tyrone Harris and the procedure being right ureteral stent placement was confirmed.  Procedure was carried out.  Time-out was performed.  Intravenous antibiotics were administered.  General LMA anesthesia introduced.  Patient was placed into a low lithotomy position.  Sterile field was created by prepping and draping patient's penis, perineum, and proximal thighs using  iodine x3.  Next, cystourethroscopy was performed using a 21-French rigid cystoscope with offset lens.  Inspection of the anterior and posterior urethra were unremarkable.  Inspection of urinary bladder revealed no diverticula, calcifications, papillary lesions.  Ureteral orifices appeared singleton.  The right ureteral orifice was cannulated with a 6- French end-hole catheter and right retrograde pyelogram was obtained.  Right retrograde pyelogram demonstrated a single right ureter with single-system right kidney.  There was a very small filling defect in proximal ureter consistent with known stone.  There was very mild hydroureteronephrosis above this.  A 0.038 Zip wire was navigated to the upper pole, over which a new 5 x 26 Polaris-type stent was placed, proximal in renal pelvis and distal in urinary bladder.  Efflux urine was seen around into the distal end of the stent.  Bladder was emptied per cystoscope.  Procedure was then terminated.  The patient tolerated procedure well.  There were no immediate periprocedural complications.  The patient was taken to the postanesthesia care unit in stable condition.          ______________________________ Sebastian Acheheodore Jerin Franzel, MD     TM/MEDQ  D:  04/04/2016  T:  04/04/2016  Job:  [id number]

## 2016-04-05 ENCOUNTER — Encounter (HOSPITAL_COMMUNITY): Payer: Self-pay | Admitting: Urology

## 2016-04-05 LAB — URINE CULTURE: Culture: NO GROWTH

## 2016-04-05 MED ORDER — OXYCODONE-ACETAMINOPHEN 5-325 MG PO TABS: 1.0000 | ORAL_TABLET | Freq: Four times a day (QID) | ORAL | 0 refills | Status: DC | PRN

## 2016-04-05 MED ORDER — SENNOSIDES-DOCUSATE SODIUM 8.6-50 MG PO TABS: 1.0000 | ORAL_TABLET | Freq: Two times a day (BID) | ORAL | 0 refills | Status: AC

## 2016-04-05 MED ORDER — CEPHALEXIN 500 MG PO CAPS: 500.0000 mg | ORAL_CAPSULE | Freq: Two times a day (BID) | ORAL | 0 refills | Status: DC

## 2016-04-05 NOTE — Discharge Summary (Signed)
Physician Discharge Summary  Patient ID: Tyrone Harris MRN: [id number] DOB/AGE: 29-Apr-1966 50 y.o.  Admit date: 04/04/2016 Discharge date: 04/05/2016  Admission Diagnoses: Right Ureteral Stone with Bacteruria  Discharge Diagnoses:  Active Problems:   Ureteral stone   Discharged Condition: good  Hospital Course:   Right Ureteral Stone with Bacteruria - 2mm Rt ureteral stone by ER CT 04/04/2016 and signifiacnt malaise and bacteruria on UA. No fevers. Underwent urgent Rt ureteral stent for renal decompression 8/8 and observed overnight on empiric rocephin. By POD 1, the day of discharge, he is ambualtory, pain controlled with PO meds, fever free, and felt to be adequate for discharge. He will continue ABX course with 5 more days of keflex and plan for second stage surgery for actual stone removal in few weeks.   Consults: None  Significant Diagnostic Studies: labs: UCX - pending  Treatments: surgery:  Rt ureteral stent for renal decompression 04/04/2016  Discharge Exam: Blood pressure 116/71, pulse 60, temperature 97.9 F (36.6 C), temperature source Oral, resp. rate 18, height 5\' 10"  (1.778 m), weight 95.5 kg (210 lb 8.6 oz), SpO2 97 %. General appearance: alert, cooperative and appears stated age Eyes: negative Nose: Nares normal. Septum midline. Mucosa normal. No drainage or sinus tenderness. Throat: lips, mucosa, and tongue normal; teeth and gums normal Neck: supple, symmetrical, trachea midline Back: symmetric, no curvature. ROM normal. No CVA tenderness. Resp: non-labored on room air.  Cardio: regular rate and rhythm, S1, S2 normal, no murmur, click, rub or gallop GI: soft, non-tender; bowel sounds normal; no masses,  no organomegaly Male genitalia: normal Extremities: extremities normal, atraumatic, no cyanosis or edema Lymph nodes: Cervical, supraclavicular, and axillary nodes normal. Neurologic: Grossly normal  Disposition: 01-Home or Self Care     Medication List     TAKE these medications   bismuth subsalicylate 262 MG/15ML suspension Commonly known as:  PEPTO BISMOL Take 30 mLs by mouth every 6 (six) hours as needed for indigestion or diarrhea or loose stools.   cephALEXin 500 MG capsule Commonly known as:  KEFLEX Take 1 capsule (500 mg total) by mouth 2 (two) times daily. X 5 days now. Also begin day prior to next Urology surgery.   loratadine 10 MG tablet Commonly known as:  CLARITIN Take 10 mg by mouth daily.   oxyCODONE-acetaminophen 5-325 MG tablet Commonly known as:  ROXICET Take 1-2 tablets by mouth every 6 (six) hours as needed for severe pain (post-operatively).   senna-docusate 8.6-50 MG tablet Commonly known as:  Senokot-S Take 1 tablet by mouth 2 (two) times daily. While taking strong pain meds to prevent constipation.      Follow-up Information    Sebastian AcheMANNY, Elina Streng, MD .   Specialty:  Urology Why:  We will call to schedule next surgery for kidney stone.  Contact information: [address] [place] [id number] [phone]           Signed: Sebastian AcheMANNY, Bennet Kujawa 04/05/2016, 9:24 AM

## 2016-04-05 NOTE — Discharge Instructions (Signed)
1 - You may have urinary urgency (bladder spasms) and bloody urine on / off with stent in place. This is normal. ° °2 - Call MD or go to ER for fever >102, severe pain / nausea / vomiting not relieved by medications, or acute change in medical status ° °

## 2016-04-05 NOTE — Progress Notes (Signed)
Pt was informed [facility] and he was also provided with coupon from [email or web address] the Roxicet. Pt appreciative of CM assistance. Sandford CrazeNora Leveon Pelzer RN,BSN,NCM

## 2016-04-06 ENCOUNTER — Other Ambulatory Visit: Payer: Self-pay | Admitting: Urology

## 2016-04-06 NOTE — Anesthesia Postprocedure Evaluation (Signed)
Anesthesia Post Note  Patient: Tyrone LanceDaniel L Harris  Procedure(s) Performed: Procedure(s) (LRB): CYSTOSCOPY WITH RETROGRADE PYELOGRAM/URETERAL STENT PLACEMENT (Right)  Patient location during evaluation: PACU Anesthesia Type: General Level of consciousness: awake and alert Pain management: pain level controlled Vital Signs Assessment: post-procedure vital signs reviewed and stable Respiratory status: spontaneous breathing, nonlabored ventilation, respiratory function stable and patient connected to nasal cannula oxygen Cardiovascular status: blood pressure returned to baseline and stable Postop Assessment: no signs of nausea or vomiting Anesthetic complications: no    Last Vitals:  Vitals:   04/05/16 0210 04/05/16 0547  BP: 112/75 116/71  Pulse: 65 60  Resp: 16 18  Temp: 36.6 C 36.6 C    Last Pain:  Vitals:   04/05/16 0859  TempSrc:   PainSc: 8                  Kennieth RadFitzgerald, Jalei Shibley E

## 2016-04-07 ENCOUNTER — Encounter (HOSPITAL_BASED_OUTPATIENT_CLINIC_OR_DEPARTMENT_OTHER): Payer: Self-pay | Admitting: *Deleted

## 2016-04-07 NOTE — Progress Notes (Signed)
NPO AFTER MN.  ARRIVE AT [phone].  CURRENT LAB RESULTS AND EKG IN CHART AND EPIC.  WILL TAKE CLARITIN AND IF NEEDED OXYCODONE AM DOS W/ SIPS OF WATER.

## 2016-04-12 ENCOUNTER — Ambulatory Visit (HOSPITAL_BASED_OUTPATIENT_CLINIC_OR_DEPARTMENT_OTHER)
Admission: RE | Admit: 2016-04-12 | Discharge: 2016-04-12 | Disposition: A | Discharge status: Home / Self Care | Payer: Medicaid - Out of State | Source: Ambulatory Visit | Attending: Urology | Admitting: Urology

## 2016-04-12 ENCOUNTER — Encounter (HOSPITAL_BASED_OUTPATIENT_CLINIC_OR_DEPARTMENT_OTHER): Payer: Self-pay | Admitting: *Deleted

## 2016-04-12 ENCOUNTER — Encounter (HOSPITAL_BASED_OUTPATIENT_CLINIC_OR_DEPARTMENT_OTHER)
Admission: RE | Disposition: A | Discharge status: Home / Self Care | Payer: Self-pay | Source: Ambulatory Visit | Attending: Urology

## 2016-04-12 ENCOUNTER — Ambulatory Visit (HOSPITAL_BASED_OUTPATIENT_CLINIC_OR_DEPARTMENT_OTHER): Payer: Medicaid - Out of State | Admitting: Anesthesiology

## 2016-04-12 DIAGNOSIS — J449 Chronic obstructive pulmonary disease, unspecified: Secondary | ICD-10-CM | POA: Insufficient documentation

## 2016-04-12 DIAGNOSIS — F17210 Nicotine dependence, cigarettes, uncomplicated: Secondary | ICD-10-CM | POA: Diagnosis not present

## 2016-04-12 DIAGNOSIS — M1990 Unspecified osteoarthritis, unspecified site: Secondary | ICD-10-CM | POA: Diagnosis not present

## 2016-04-12 DIAGNOSIS — N132 Hydronephrosis with renal and ureteral calculous obstruction: Secondary | ICD-10-CM | POA: Insufficient documentation

## 2016-04-12 DIAGNOSIS — Z8673 Personal history of transient ischemic attack (TIA), and cerebral infarction without residual deficits: Secondary | ICD-10-CM | POA: Diagnosis not present

## 2016-04-12 DIAGNOSIS — Z96643 Presence of artificial hip joint, bilateral: Secondary | ICD-10-CM | POA: Insufficient documentation

## 2016-04-12 DIAGNOSIS — N201 Calculus of ureter: Secondary | ICD-10-CM | POA: Diagnosis present

## 2016-04-12 HISTORY — PX: STONE EXTRACTION WITH BASKET: SHX5318

## 2016-04-12 HISTORY — DX: Personal history of other diseases of the nervous system and sense organs: Z86.69

## 2016-04-12 HISTORY — DX: Chronic obstructive pulmonary disease, unspecified: J44.9

## 2016-04-12 HISTORY — DX: Personal history of urinary calculi: Z87.442

## 2016-04-12 HISTORY — DX: Calculus of ureter: N20.1

## 2016-04-12 HISTORY — DX: Personal history of transient ischemic attack (TIA), and cerebral infarction without residual deficits: Z86.73

## 2016-04-12 HISTORY — PX: CYSTOSCOPY WITH RETROGRADE PYELOGRAM, URETEROSCOPY AND STENT PLACEMENT: SHX5789

## 2016-04-12 SURGERY — CYSTOURETEROSCOPY, WITH RETROGRADE PYELOGRAM AND STENT INSERTION
Anesthesia: General | Site: Renal | Laterality: Right

## 2016-04-12 MED ORDER — MIDAZOLAM HCL 5 MG/5ML IJ SOLN
INTRAMUSCULAR | Status: DC | PRN
  Administered 2016-04-12: 2 mg via INTRAVENOUS

## 2016-04-12 MED ORDER — OXYCODONE-ACETAMINOPHEN 5-325 MG PO TABS
ORAL_TABLET | ORAL | Status: AC
  Filled 2016-04-12: qty 1

## 2016-04-12 MED ORDER — OXYCODONE-ACETAMINOPHEN 5-325 MG PO TABS: 1.0000 | ORAL_TABLET | Freq: Four times a day (QID) | ORAL | 0 refills | Status: DC | PRN

## 2016-04-12 MED ORDER — IOHEXOL 300 MG/ML  SOLN
INTRAMUSCULAR | Status: DC | PRN
  Administered 2016-04-12: 6 mL

## 2016-04-12 MED ORDER — SODIUM CHLORIDE 0.9 % IR SOLN
Status: DC | PRN
  Administered 2016-04-12: 4000 mL

## 2016-04-12 MED ORDER — DEXAMETHASONE SODIUM PHOSPHATE 10 MG/ML IJ SOLN
INTRAMUSCULAR | Status: AC
  Filled 2016-04-12: qty 1

## 2016-04-12 MED ORDER — PROMETHAZINE HCL 25 MG/ML IJ SOLN
6.2500 mg | INTRAMUSCULAR | Status: DC | PRN
Start: 2016-04-12 — End: 2016-04-12
  Filled 2016-04-12: qty 1

## 2016-04-12 MED ORDER — LACTATED RINGERS IV SOLN
INTRAVENOUS | Status: DC
  Administered 2016-04-12 (×2): via INTRAVENOUS
  Filled 2016-04-12: qty 1000

## 2016-04-12 MED ORDER — GENTAMICIN IN SALINE 1.6-0.9 MG/ML-% IV SOLN
80.0000 mg | INTRAVENOUS | Status: AC
  Administered 2016-04-12: 440 mg via INTRAVENOUS
  Filled 2016-04-12: qty 50

## 2016-04-12 MED ORDER — PROPOFOL 500 MG/50ML IV EMUL
INTRAVENOUS | Status: AC
  Filled 2016-04-12: qty 50

## 2016-04-12 MED ORDER — MIDAZOLAM HCL 2 MG/2ML IJ SOLN
INTRAMUSCULAR | Status: AC
  Filled 2016-04-12: qty 2

## 2016-04-12 MED ORDER — HYDROMORPHONE HCL 1 MG/ML IJ SOLN
0.2500 mg | INTRAMUSCULAR | Status: DC | PRN
  Administered 2016-04-12 (×2): 0.5 mg via INTRAVENOUS
  Filled 2016-04-12: qty 1

## 2016-04-12 MED ORDER — ONDANSETRON HCL 4 MG/2ML IJ SOLN
INTRAMUSCULAR | Status: DC | PRN
  Administered 2016-04-12: 4 mg via INTRAVENOUS

## 2016-04-12 MED ORDER — KETOROLAC TROMETHAMINE 30 MG/ML IJ SOLN
INTRAMUSCULAR | Status: AC
  Filled 2016-04-12: qty 1

## 2016-04-12 MED ORDER — ONDANSETRON HCL 4 MG/2ML IJ SOLN
INTRAMUSCULAR | Status: AC
  Filled 2016-04-12: qty 2

## 2016-04-12 MED ORDER — KETOROLAC TROMETHAMINE 30 MG/ML IJ SOLN
INTRAMUSCULAR | Status: DC | PRN
  Administered 2016-04-12: 30 mg via INTRAVENOUS

## 2016-04-12 MED ORDER — HYDROMORPHONE HCL 1 MG/ML IJ SOLN
INTRAMUSCULAR | Status: AC
  Filled 2016-04-12: qty 1

## 2016-04-12 MED ORDER — OXYCODONE-ACETAMINOPHEN 5-325 MG PO TABS
1.0000 | ORAL_TABLET | Freq: Four times a day (QID) | ORAL | Status: DC | PRN
  Administered 2016-04-12: 1 via ORAL
  Filled 2016-04-12: qty 2

## 2016-04-12 MED ORDER — LIDOCAINE HCL (CARDIAC) 20 MG/ML IV SOLN
INTRAVENOUS | Status: DC | PRN
  Administered 2016-04-12: 100 mg via INTRAVENOUS

## 2016-04-12 MED ORDER — FENTANYL CITRATE (PF) 100 MCG/2ML IJ SOLN
INTRAMUSCULAR | Status: DC | PRN
  Administered 2016-04-12 (×2): 50 ug via INTRAVENOUS

## 2016-04-12 MED ORDER — DEXAMETHASONE SODIUM PHOSPHATE 4 MG/ML IJ SOLN
INTRAMUSCULAR | Status: DC | PRN
  Administered 2016-04-12: 10 mg via INTRAVENOUS

## 2016-04-12 MED ORDER — PHENAZOPYRIDINE HCL 100 MG PO TABS
ORAL_TABLET | ORAL | Status: AC
  Filled 2016-04-12: qty 2

## 2016-04-12 MED ORDER — ARTIFICIAL TEARS OP OINT
TOPICAL_OINTMENT | OPHTHALMIC | Status: AC
  Filled 2016-04-12: qty 3.5

## 2016-04-12 MED ORDER — PROPOFOL 10 MG/ML IV BOLUS
INTRAVENOUS | Status: DC | PRN
  Administered 2016-04-12: 200 mg via INTRAVENOUS

## 2016-04-12 MED ORDER — GENTAMICIN SULFATE 40 MG/ML IJ SOLN
5.0000 mg/kg | Freq: Once | INTRAVENOUS | Status: AC
  Administered 2016-04-12 (×2): 440 mg via INTRAVENOUS
  Filled 2016-04-12: qty 11

## 2016-04-12 MED ORDER — LIDOCAINE HCL (CARDIAC) 20 MG/ML IV SOLN
INTRAVENOUS | Status: AC
  Filled 2016-04-12: qty 5

## 2016-04-12 MED ORDER — FENTANYL CITRATE (PF) 100 MCG/2ML IJ SOLN
INTRAMUSCULAR | Status: AC
  Filled 2016-04-12: qty 4

## 2016-04-12 MED ORDER — CEPHALEXIN 500 MG PO CAPS: 500.0000 mg | ORAL_CAPSULE | Freq: Two times a day (BID) | ORAL | 0 refills | Status: DC

## 2016-04-12 MED ORDER — PHENAZOPYRIDINE HCL 200 MG PO TABS
200.0000 mg | ORAL_TABLET | Freq: Once | ORAL | Status: AC
  Administered 2016-04-12: 200 mg via ORAL
  Filled 2016-04-12: qty 1

## 2016-04-12 MED ORDER — PROPOFOL 10 MG/ML IV BOLUS
INTRAVENOUS | Status: AC
  Filled 2016-04-12: qty 20

## 2016-04-12 SURGICAL SUPPLY — 47 items
BAG DRAIN URO-CYSTO SKYTR STRL (DRAIN) ×3 IMPLANT
BAG DRN UROCATH (DRAIN) ×2
BASKET DAKOTA 1.9FR 11X120 (BASKET) IMPLANT
BASKET LASER NITINOL 1.9FR (BASKET) ×2 IMPLANT
BASKET STNLS GEMINI 4WIRE 3FR (BASKET) IMPLANT
BASKET ZERO TIP NITINOL 2.4FR (BASKET) IMPLANT
BSKT STON RTRVL 120 1.9FR (BASKET) ×2
BSKT STON RTRVL GEM 120X11 3FR (BASKET)
BSKT STON RTRVL ZERO TP 2.4FR (BASKET)
CATH INTERMIT  6FR 70CM (CATHETERS) ×3 IMPLANT
CATH URET 5FR 28IN CONE TIP (BALLOONS)
CATH URET 5FR 28IN OPEN ENDED (CATHETERS) IMPLANT
CATH URET 5FR 70CM CONE TIP (BALLOONS) IMPLANT
CLOTH BEACON ORANGE TIMEOUT ST (SAFETY) ×3 IMPLANT
ELECT REM PT RETURN 9FT ADLT (ELECTROSURGICAL)
ELECTRODE REM PT RTRN 9FT ADLT (ELECTROSURGICAL) IMPLANT
FIBER LASER FLEXIVA 365 (UROLOGICAL SUPPLIES) IMPLANT
FIBER LASER TRAC TIP (UROLOGICAL SUPPLIES) IMPLANT
GLOVE BIO SURGEON STRL SZ7.5 (GLOVE) ×3 IMPLANT
GLOVE BIOGEL PI IND STRL 7.0 (GLOVE) ×1 IMPLANT
GLOVE BIOGEL PI IND STRL 7.5 (GLOVE) ×1 IMPLANT
GLOVE BIOGEL PI INDICATOR 7.0 (GLOVE) ×1
GLOVE BIOGEL PI INDICATOR 7.5 (GLOVE) ×1
GLOVE ECLIPSE 7.0 STRL STRAW (GLOVE) ×2 IMPLANT
GOWN STRL REUS W/ TWL LRG LVL3 (GOWN DISPOSABLE) ×4 IMPLANT
GOWN STRL REUS W/ TWL XL LVL3 (GOWN DISPOSABLE) ×3 IMPLANT
GOWN STRL REUS W/TWL LRG LVL3 (GOWN DISPOSABLE) ×6
GOWN STRL REUS W/TWL XL LVL3 (GOWN DISPOSABLE) ×6
GUIDEWIRE 0.038 PTFE COATED (WIRE) IMPLANT
GUIDEWIRE ANG ZIPWIRE 038X150 (WIRE) ×3 IMPLANT
GUIDEWIRE STR DUAL SENSOR (WIRE) ×3 IMPLANT
IV NS 1000ML (IV SOLUTION) ×3
IV NS 1000ML BAXH (IV SOLUTION) ×2 IMPLANT
IV NS IRRIG 3000ML ARTHROMATIC (IV SOLUTION) ×4 IMPLANT
KIT BALLIN UROMAX 15FX10 (LABEL) IMPLANT
KIT BALLN UROMAX 15FX4 (MISCELLANEOUS) IMPLANT
KIT BALLN UROMAX 26 75X4 (MISCELLANEOUS)
KIT ROOM TURNOVER WOR (KITS) ×3 IMPLANT
MANIFOLD NEPTUNE II (INSTRUMENTS) ×3 IMPLANT
NS IRRIG 500ML POUR BTL (IV SOLUTION) ×3 IMPLANT
PACK CYSTO (CUSTOM PROCEDURE TRAY) ×3 IMPLANT
SET HIGH PRES BAL DIL (LABEL)
STENT POLARIS 5FRX26 (STENTS) ×2 IMPLANT
SYRINGE 10CC LL (SYRINGE) ×3 IMPLANT
SYRINGE IRR TOOMEY STRL 70CC (SYRINGE) IMPLANT
TUBE CONNECTING 12X1/4 (SUCTIONS) IMPLANT
TUBE FEEDING 8FR 16IN STR KANG (MISCELLANEOUS) ×3 IMPLANT

## 2016-04-12 NOTE — Anesthesia Procedure Notes (Addendum)
Procedure Name: LMA Insertion Date/Time: 04/12/2016 10:10 AM Performed by: Tyrone NineSAUVE, Latoy Labriola F Pre-anesthesia Checklist: Patient identified, Timeout performed, Emergency Drugs available, Suction available and Patient being monitored Patient Re-evaluated:Patient Re-evaluated prior to inductionOxygen Delivery Method: Circle system utilized Preoxygenation: Pre-oxygenation with 100% oxygen Intubation Type: IV induction Ventilation: Mask ventilation without difficulty LMA: LMA inserted LMA Size: 5.0 Number of attempts: 1 Placement Confirmation: positive ETCO2 and breath sounds checked- equal and bilateral Tube secured with: Tape Dental Injury: Teeth and Oropharynx as per pre-operative assessment

## 2016-04-12 NOTE — Brief Op Note (Signed)
04/12/2016  10:37 AM  PATIENT:  Tyrone Harris  50 y.o. male  PRE-OPERATIVE DIAGNOSIS:  RIGHT URETERAL STONE  POST-OPERATIVE DIAGNOSIS:  RIGHT URETERAL STONE  PROCEDURE:  Procedure(s) with comments: CYSTOSCOPY WITH RETROGRADE PYELOGRAM, URETEROSCOPY AND STENT EXCHANGE (Right) - 1 HR NEEDS DIGITAL URETEROSCOPE [phone]  INSURANCE PER EPIC HOLMIUM LASER APPLICATION (Right) STONE EXTRACTION WITH BASKET (Right)  SURGEON:  Surgeon(s) and Role:    * Sebastian Acheheodore Balbina Depace, MD - Primary  PHYSICIAN ASSISTANT:   ASSISTANTS: none   ANESTHESIA:   general  EBL:  Total I/O In: 400 [I.V.:400] Out: 0   BLOOD ADMINISTERED:none  DRAINS: none   LOCAL MEDICATIONS USED:  NONE  SPECIMEN:  Source of Specimen:  Rt proximal ureteral stone  DISPOSITION OF SPECIMEN:  [facility] for compositional analysis  COUNTS:  YES  TOURNIQUET:  * No tourniquets in log *  DICTATION: .Other Dictation: Dictation Number [id number]  PLAN OF CARE: Discharge to home after PACU  PATIENT DISPOSITION:  PACU - hemodynamically stable.   Delay start of Pharmacological VTE agent (>24hrs) due to surgical blood loss or risk of bleeding: yes

## 2016-04-12 NOTE — H&P (Signed)
Tyrone Harris is an 50 y.o. male.    Chief Complaint: Pre-op RIGHT ureteroscopic stone manipulation  HPI:   Right Ureteral Stone - 2mm Rt prox ureteral stone by ER CT on eval severe abd pain and malaise / diaphoresis. Stone is solitary and just below UPJ on right. No fevers or leukocytosis but severe malaise and bacteruria / + nitrites. Underwent ureteral stent placement 04/04/16 for urgent decompression. Final UCX negative.   PMH sig for bilateral hip replacement, knee surgery, remote CVA (minimal deficits), no limiting CV disease, no blood thinners.   Today "Tyrone HuhDanny " is seen to proceed with RIGHT ureteroscopy and stone retrieval. NO interval fevers or stone passage.   Past Medical History:  Diagnosis Date  . COPD (chronic obstructive pulmonary disease) (HCC)   . History of Bell's palsy    2001--age 67  right side  . History of CVA (cerebrovascular accident)    2002-- age 50--  no residual's  . History of kidney stones   . Right ureteral stone     Past Surgical History:  Procedure Laterality Date  . CYSTOSCOPY W/ URETERAL STENT PLACEMENT Right 04/04/2016   Procedure: CYSTOSCOPY WITH RETROGRADE PYELOGRAM/URETERAL STENT PLACEMENT;  Surgeon: Tyrone Acheheodore Niccolo Burggraf, MD;  Location: [facility];  Service: Urology;  Laterality: Right;  . KNEE ARTHROSCOPY Right 2009  . TOTAL HIP ARTHROPLASTY Bilateral 2012  and 2009    History reviewed. No pertinent family history. Social History:  reports that he has been smoking Cigarettes.  He has a 60.00 pack-year smoking history. He has never used smokeless tobacco. He reports that he uses drugs, including Marijuana, about 1 time per week. He reports that he does not drink alcohol.  Allergies: No Known Allergies  No prescriptions prior to admission.    No results found for this or any previous visit (from the past 48 hour(s)). No results found.  Review of Systems  Constitutional: Negative.  Negative for fever.  HENT: Negative.   Eyes: Negative.    Respiratory: Negative.   Cardiovascular: Negative.   Gastrointestinal: Negative.   Genitourinary: Negative.   Musculoskeletal: Negative.   Skin: Negative.   Neurological: Negative.   Endo/Heme/Allergies: Negative.   Psychiatric/Behavioral: Negative.     There were no vitals taken for this visit. Physical Exam  Constitutional: He is oriented to person, place, and time. He appears well-developed.  HENT:  Head: Normocephalic.  Eyes: Pupils are equal, round, and reactive to light.  Neck: Normal range of motion.  Cardiovascular: Normal rate.   Respiratory: Effort normal.  GI: Soft.  Genitourinary: Penis normal.  Genitourinary Comments: No CVAT at present  Musculoskeletal: Normal range of motion.  Neurological: He is alert and oriented to person, place, and time.  Skin: Skin is warm.  Psychiatric: He has a normal mood and affect. His behavior is normal. Judgment and thought content normal.     Assessment/Plan Right Ureteral Stone - proceed as planned with right ureteroscopy and basked extraction v. Laser lithotripsy, possible stent exchange. Risks, benefits, alternatives, expected peri-op course discussed.   Tyrone Harris, Jerilynn Feldmeier, MD 04/12/2016, 7:03 AM

## 2016-04-12 NOTE — Op Note (Signed)
NAMMerleen Harris:  [name], [name]                ACCOUNT NO.:  [id number]  MEDICAL RECORD NO.:  [id number]  LOCATION:                                 FACILITY:  PHYSICIAN:  Sebastian Acheheodore Cora Stetson, MD     DATE OF BIRTH:  January 29, 1966  DATE OF PROCEDURE: 04/12/2016                               OPERATIVE REPORT   DIAGNOSIS:  Right proximal ureteral stone.  PROCEDURE: 1. Cystoscopy with right retrograde pyelogram interpretation. 2. Right ureteroscopy with basketing of stone. 3. Right ureteral stent exchange 5 x 26 Polaris with tether.  ESTIMATED BLOOD LOSS:  Nil.  COMPLICATIONS:  None.  SPECIMENS:  Right proximal ureteral stone for compositional analysis.  FINDINGS: 1. Right proximal ureteral stone with mild mucosal edema. 2. Complete resolution of all stone fragments larger than 1/3rd mm     following basket extraction. 3. Successful replacement of right ureteral stent, proximal in renal     pelvis and distal in urinary bladder.  INDICATION:  Tyrone Harris is a quite pleasant 50 year old gentleman with history of recurrent nephrolithiasis.  He presented approximately a week ago with prodrome of colicky right flank pain and significant malaise and bacteriuria and a right proximal ureteral stone with hydronephrosis. At that time, the picture was concerning for impending septic stone.  As such, he underwent a right ureteral stent placement for urgent renal decompression.  His infectious parameters have since cleared.  His most recent cultures are negative.  He has been on a trial of medical therapy in the interval, but has failed to pass the stone, and he wished to proceed with ureteroscopy for definitive stone management with goal of stone free.  Informed consent was obtained and placed in the medical record.  PROCEDURE IN DETAIL:  The patient being Tyrone Harris verified. Procedure being right ureteroscopic stone manipulation was confirmed. Procedure was carried out.  Time-out was performed.   Intravenous antibiotics were administered.  General LMA anesthesia introduced.  The patient placed into a low lithotomy position.  Sterile field was created by prepping and draping the patient's penis, perineum, and proximal thighs using iodine.  Next, cystourethroscopy was performed using a 21- French rigid cystoscope with offset lens.  Inspection of the anterior and posterior urethra unremarkable.  Inspection of urinary bladder revealed distal end of right ureteral stent in situ.  This was grasped, brought to the level of the urethral meatus through which a 0.038 zip wire was advanced to the level of upper pole and the stent was exchanged for an open-ended catheter and right retrograde pyelogram was obtained.  Right retrograde pyelogram demonstrated a single right ureter with single-system right kidney.  There were no obvious filling defects noted, but given the small size of the stone, this was not felt to adequately rule out persistence of stone.  As such, the zip wire was once again advanced, set aside as a safety wire, and an 8-French feeding tube placed in the urinary bladder for pressure release and semi-rigid ureteroscopy was performed the distal two-thirds of the right ureter alongside a separate Sensor working wire.  No mucosal abnormalities were found.  The semi-rigid scope was exchanged for the single channel.  A flexible digital ureteroscope  over the Sensor working wire using continuous fluoroscopic guidance to the level of the upper pole. Systematic inspection of the right kidney was performed including all calices x2.  No stones were seen.  As the scope was withdrawn, the stone was indeed seen at the area of the UPJ as per prior.  It did appear amenable to simple basketing.  There was some mild-to-moderate mucosal edema at the site of stone impaction.  The stone was grasped with an Escape basket, it was in long axis.  It was removed in its entirety, set aside for  compositional analysis.  Given the ureteral edema, it was felt that interval stenting would be warranted.  As such, a new 5 x 26 Polaris-type stent was placed using fluoroscopic guidance, proximal end in upper pole, distal end in urinary bladder.  Tether was left in place and fashioned to the dorsum of the penis.  Procedure was terminated. The patient tolerated the procedure well.  There were no immediate periprocedural complications.  The patient was taken to the postanesthesia care unit in a stable condition.    ______________________________ Sebastian Acheheodore Alaena Strader, MD   ______________________________ Sebastian Acheheodore Luqman Perrelli, MD    TM/MEDQ  D:  04/12/2016  T:  04/12/2016  Job:  [id number]

## 2016-04-12 NOTE — Anesthesia Preprocedure Evaluation (Addendum)
Anesthesia Evaluation  Patient identified by MRN, date of birth, ID band Patient awake    Reviewed: Allergy & Precautions, NPO status , Patient's Chart, lab work & pertinent test results  History of Anesthesia Complications Negative for: history of anesthetic complications  Airway Mallampati: I  TM Distance: >3 FB Neck ROM: Full    Dental  (+) Teeth Intact, Dental Advisory Given,    Pulmonary COPD, Current Smoker,    breath sounds clear to auscultation       Cardiovascular Exercise Tolerance: Good negative cardio ROS   Rhythm:Regular Rate:Normal     Neuro/Psych CVA, No Residual Symptoms    GI/Hepatic negative GI ROS, (+)     substance abuse  ,   Endo/Other  negative endocrine ROS  Renal/GU Renal Calculus     Musculoskeletal negative musculoskeletal ROS (+) Arthritis ,   Abdominal   Peds  Hematology negative hematology ROS (+)   Anesthesia Other Findings   Reproductive/Obstetrics                           Anesthesia Physical Anesthesia Plan  ASA: II  Anesthesia Plan: General   Post-op Pain Management:    Induction: Intravenous  Airway Management Planned: LMA  Additional Equipment:   Intra-op Plan:   Post-operative Plan: Extubation in OR  Informed Consent: I have reviewed the patients History and Physical, chart, labs and discussed the procedure including the risks, benefits and alternatives for the proposed anesthesia with the patient or authorized representative who has indicated his/her understanding and acceptance.   Dental advisory given  Plan Discussed with: CRNA  Anesthesia Plan Comments:         Anesthesia Quick Evaluation

## 2016-04-12 NOTE — Transfer of Care (Signed)
Immediate Anesthesia Transfer of Care Note  Patient: Tyrone Harris  Procedure(s) Performed: Procedure(s): CYSTOSCOPY WITH RETROGRADE PYELOGRAM, URETEROSCOPY AND STENT EXCHANGE (Right) STONE EXTRACTION WITH BASKET (Right)  Patient Location: PACU  Anesthesia Type:General  Level of Consciousness: awake, alert , oriented and patient cooperative  Airway & Oxygen Therapy: Patient Spontanous Breathing and Patient connected to nasal cannula oxygen  Post-op Assessment: Report given to RN and Post -op Vital signs reviewed and stable  Post vital signs: Reviewed and stable  Last Vitals:  Vitals:   04/12/16 0705 04/12/16 0821  BP: (!) 178/77 (!) 134/93  Pulse: (!) 59 94  Resp: 18 16  Temp: 37 C 36.8 C    Last Pain:  Vitals:   04/12/16 0828  TempSrc:   PainSc: 8          Complications: No apparent anesthesia complications

## 2016-04-12 NOTE — Anesthesia Postprocedure Evaluation (Signed)
Anesthesia Post Note  Patient: Tyrone Harris  Procedure(s) Performed: Procedure(s) (LRB): CYSTOSCOPY WITH RETROGRADE PYELOGRAM, URETEROSCOPY AND STENT EXCHANGE (Right) STONE EXTRACTION WITH BASKET (Right)  Patient location during evaluation: PACU Anesthesia Type: General Level of consciousness: awake and alert Pain management: pain level controlled Vital Signs Assessment: post-procedure vital signs reviewed and stable Respiratory status: spontaneous breathing, nonlabored ventilation, respiratory function stable and patient connected to nasal cannula oxygen Cardiovascular status: blood pressure returned to baseline and stable Postop Assessment: no signs of nausea or vomiting Anesthetic complications: no    Last Vitals:  Vitals:   04/12/16 1100 04/12/16 1200  BP: (!) 137/103 (!) 140/93  Pulse: 67 74  Resp: (!) 9 16  Temp:  36.8 C    Last Pain:  Vitals:   04/12/16 1145  TempSrc:   PainSc: 5                  Kennieth RadFitzgerald, Adisa Litt E

## 2016-04-12 NOTE — Discharge Instructions (Signed)
1 - You may have urinary urgency (bladder spasms) and bloody urine on / off with stent in place. This is normal.  2 - Pull tethered stent on Friday morning at home by pulling on string, then blue-white plastic tubing, and discarding. Office is open Friday if any acute issues arise.   3- Call MD or go to ER for fever >102, severe pain / nausea / vomiting not relieved by medications, or acute change in medical status  [facility] [phone] Post Ureteroscopy With or Without Stent Instructions  Definitions:  Ureter: The duct that transports urine from the kidney to the bladder. Stent:   A plastic hollow tube that is placed into the ureter, from the kidney to the                 bladder to prevent the ureter from swelling shut.  GENERAL INSTRUCTIONS:  Despite the fact that no skin incisions were used, the area around the ureter and bladder is raw and irritated. The stent is a foreign body which will further irritate the bladder wall. This irritation is manifested by increased frequency of urination, both day and night, and by an increase in the urge to urinate. In some, the urge to urinate is present almost always. Sometimes the urge is strong enough that you may not be able to stop yourself from urinating. The only real cure is to remove the stent and then give time for the bladder wall to heal which can't be done until the danger of the ureter swelling shut has passed, which varies.  You may see some blood in your urine while the stent is in place and a few days afterwards. Do not be alarmed, even if the urine was clear for a while. Get off your feet and drink lots of fluids until clearing occurs. If you start to pass clots or don't improve, call us.  DIET: You may return to your normal diet immediately. Because of the raw surface of your bladder, alcohol, spicy foods, acid type foods and drinks with caffeine may cause irritation or frequency and should be used in moderation.  To keep your urine flowing freely and to avoid constipation, drink plenty of fluids during the day ( 8-10 glasses ). Tip: Avoid cranberry juice because it is very acidic.  ACTIVITY: Your physical activity doesn't need to be restricted. However, if you are very active, you may see some blood in your urine. We suggest that you reduce your activity under these circumstances until the bleeding has stopped.  BOWELS: It is important to keep your bowels regular during the postoperative period. Straining with bowel movements can cause bleeding. A bowel movement every other day is reasonable. Use a mild laxative if needed, such as Milk of Magnesia 2-3 tablespoons, or 2 Dulcolax tablets. Call if you continue to have problems. If you have been taking narcotics for pain, before, during or after your surgery, you may be constipated. Take a laxative if necessary.   MEDICATION: You should resume your pre-surgery medications unless told not to. In addition you will often be given an antibiotic to prevent infection. These should be taken as prescribed until the bottles are finished unless you are having an unusual reaction to one of the drugs.  PROBLEMS YOU SHOULD REPORT TO US:  Fevers over 100.5 Fahrenheit.  Heavy bleeding, or clots ( See above notes about blood in urine ).  Inability to urinate.  Drug reactions ( hives, rash, nausea, vomiting, diarrhea ).  Severe burning or pain with urination that is not improving.  FOLLOW-UP: You will need a follow-up appointment to monitor your progress. Call for this appointment at the number listed above. Usually the first appointment will be about three to fourteen days after your surgery.      Post Anesthesia Home Care Instructions  Activity: Get plenty of rest for the remainder of the day. A responsible adult should stay with you for 24 hours following the procedure.  For the next 24 hours, DO NOT: -Drive a car -Paediatric nurse -Drink alcoholic  beverages -Take any medication unless instructed by your physician -Make any legal decisions or sign important papers.  Meals: Start with liquid foods such as gelatin or soup. Progress to regular foods as tolerated. Avoid greasy, spicy, heavy foods. If nausea and/or vomiting occur, drink only clear liquids until the nausea and/or vomiting subsides. Call your physician if vomiting continues.  Special Instructions/Symptoms: Your throat may feel dry or sore from the anesthesia or the breathing tube placed in your throat during surgery. If this causes discomfort, gargle with warm salt water. The discomfort should disappear within 24 hours.  If you had a scopolamine patch placed behind your ear for the management of post- operative nausea and/or vomiting:  1. The medication in the patch is effective for 72 hours, after which it should be removed.  Wrap patch in a tissue and discard in the trash. Wash hands thoroughly with soap and water. 2. You may remove the patch earlier than 72 hours if you experience unpleasant side effects which may include dry mouth, dizziness or visual disturbances. 3. Avoid touching the patch. Wash your hands with soap and water after contact with the patch.

## 2016-04-13 ENCOUNTER — Encounter (HOSPITAL_BASED_OUTPATIENT_CLINIC_OR_DEPARTMENT_OTHER): Payer: Self-pay | Admitting: Urology

## 2016-04-14 ENCOUNTER — Emergency Department (HOSPITAL_COMMUNITY)
Admission: EM | Admit: 2016-04-14 | Discharge: 2016-04-15 | Disposition: A | Discharge status: Home / Self Care | Payer: Medicaid - Out of State | Attending: Emergency Medicine | Admitting: Emergency Medicine

## 2016-04-14 ENCOUNTER — Encounter (HOSPITAL_COMMUNITY): Payer: Self-pay | Admitting: Emergency Medicine

## 2016-04-14 DIAGNOSIS — F1290 Cannabis use, unspecified, uncomplicated: Secondary | ICD-10-CM | POA: Diagnosis not present

## 2016-04-14 DIAGNOSIS — J449 Chronic obstructive pulmonary disease, unspecified: Secondary | ICD-10-CM | POA: Insufficient documentation

## 2016-04-14 DIAGNOSIS — Z791 Long term (current) use of non-steroidal anti-inflammatories (NSAID): Secondary | ICD-10-CM | POA: Diagnosis not present

## 2016-04-14 DIAGNOSIS — R109 Unspecified abdominal pain: Secondary | ICD-10-CM | POA: Diagnosis not present

## 2016-04-14 DIAGNOSIS — F17210 Nicotine dependence, cigarettes, uncomplicated: Secondary | ICD-10-CM | POA: Diagnosis not present

## 2016-04-14 LAB — BASIC METABOLIC PANEL
Anion gap: 7 (ref 5–15)
BUN: 18 mg/dL (ref 6–20)
CO2: 27 mmol/L (ref 22–32)
Calcium: 9.6 mg/dL (ref 8.9–10.3)
Chloride: 103 mmol/L (ref 101–111)
Creatinine, Ser: 1.23 mg/dL (ref 0.61–1.24)
GFR calc Af Amer: >60 mL/min (ref >60)
GFR calc non Af Amer: >60 mL/min (ref >60)
Glucose, Bld: 112 mg/dL — ABNORMAL HIGH (ref 65–99)
Potassium: 4.1 mmol/L (ref 3.5–5.1)
Sodium: 137 mmol/L (ref 135–145)

## 2016-04-14 LAB — CBC WITH DIFFERENTIAL/PLATELET
Basophils Absolute: 0.1 10*3/uL (ref 0.0–0.1)
Basophils Relative: 0 %
Eosinophils Absolute: 0 10*3/uL (ref 0.0–0.7)
Eosinophils Relative: 0 %
HCT: 47.3 % (ref 39.0–52.0)
Hemoglobin: 16.3 g/dL (ref 13.0–17.0)
Lymphocytes Relative: 26 %
Lymphs Abs: 3.2 10*3/uL (ref 0.7–4.0)
MCH: 30.9 pg (ref 26.0–34.0)
MCHC: 34.5 g/dL (ref 30.0–36.0)
MCV: 89.8 fL (ref 78.0–100.0)
Monocytes Absolute: 0.9 10*3/uL (ref 0.1–1.0)
Monocytes Relative: 7 %
Neutro Abs: 8.5 10*3/uL — ABNORMAL HIGH (ref 1.7–7.7)
Neutrophils Relative %: 67 %
Platelets: 315 10*3/uL (ref 150–400)
RBC: 5.27 MIL/uL (ref 4.22–5.81)
RDW: 13.2 % (ref 11.5–15.5)
WBC: 12.7 10*3/uL — ABNORMAL HIGH (ref 4.0–10.5)

## 2016-04-14 LAB — URINALYSIS, ROUTINE W REFLEX MICROSCOPIC
Glucose, UA: NEGATIVE mg/dL
Ketones, ur: NEGATIVE mg/dL
Nitrite: NEGATIVE
Protein, ur: >300 mg/dL — AB
Specific Gravity, Urine: 1.029 (ref 1.005–1.030)
pH: 5 (ref 5.0–8.0)

## 2016-04-14 LAB — URINE MICROSCOPIC-ADD ON
Bacteria, UA: NONE SEEN
Squamous Epithelial / LPF: NONE SEEN

## 2016-04-14 MED ORDER — HYDROMORPHONE HCL 1 MG/ML IJ SOLN
1.0000 mg | Freq: Once | INTRAMUSCULAR | Status: AC
  Administered 2016-04-14: 1 mg via INTRAVENOUS
  Filled 2016-04-14: qty 1

## 2016-04-14 MED ORDER — FENTANYL CITRATE (PF) 100 MCG/2ML IJ SOLN
50.0000 ug | INTRAMUSCULAR | Status: DC | PRN
  Administered 2016-04-14: 50 ug via INTRAVENOUS
  Filled 2016-04-14: qty 2

## 2016-04-14 MED ORDER — KETOROLAC TROMETHAMINE 15 MG/ML IJ SOLN
15.0000 mg | Freq: Once | INTRAMUSCULAR | Status: AC
  Administered 2016-04-14: 15 mg via INTRAVENOUS
  Filled 2016-04-14: qty 1

## 2016-04-14 NOTE — ED Triage Notes (Signed)
Pt c/o R flank pain/R abd pain, severe onset tonight. Pt states he had cysto done Wed and was removed stent at home tonight without difficulty. Pt states Percocet 1-2 hour ago has not eased pain.+nausea.

## 2016-04-14 NOTE — ED Provider Notes (Signed)
[facility]-EMERGENCY DEPT Provider Note   CSN: [id number] Arrival date & time: 04/14/16  2100     History   Chief Complaint Chief Complaint  Patient presents with  . Flank Pain    HPI Tyrone Harris is a 50 y.o. male.  HPI   50 year old male presents today with complaints of right flank pain. Patient underwent cystoscopy with retrograde pyelogram, and ureteralscopy and stent exchange on 04/12/2016.  Patient reports that he has been doing well since the procedure, denies any fever or chills nausea or vomiting. He reports he's been taking Anaprox as directed. Patient notes that today was the day use was take out his stent, he reports removing the stent without difficulty. He reports proximal one hour later started developing right flank pain radiating down into his groin and right testicle. He reports the pain is very severe, sharp in nature, worse than prior to stent placement. Patient reports trying at home medications with no improvement in symptoms. She reports he was able to urinate after stent removal without difficulty. Small amount of blood noted in his urine.   Past Medical History:  Diagnosis Date  . COPD (chronic obstructive pulmonary disease) (HCC)   . History of Bell's palsy    2001--age 71  right side  . History of CVA (cerebrovascular accident)    2002-- age 50--  no residual's  . History of kidney stones   . Right ureteral stone     Patient Active Problem List   Diagnosis Date Noted  . Ureteral stone 04/04/2016    Past Surgical History:  Procedure Laterality Date  . CYSTOSCOPY W/ URETERAL STENT PLACEMENT Right 04/04/2016   Procedure: CYSTOSCOPY WITH RETROGRADE PYELOGRAM/URETERAL STENT PLACEMENT;  Surgeon: Sebastian Acheheodore Manny, MD;  Location: [facility];  Service: Urology;  Laterality: Right;  . CYSTOSCOPY WITH RETROGRADE PYELOGRAM, URETEROSCOPY AND STENT PLACEMENT Right 04/12/2016   Procedure: CYSTOSCOPY WITH RETROGRADE PYELOGRAM, URETEROSCOPY AND STENT EXCHANGE;  Surgeon:  Sebastian Acheheodore Manny, MD;  [facility];  Service: Urology;  Laterality: Right;  . KNEE ARTHROSCOPY Right 2009  . STONE EXTRACTION WITH BASKET Right 04/12/2016   Procedure: STONE EXTRACTION WITH BASKET;  Surgeon: Sebastian Acheheodore Manny, MD;  [facility] [place];  Service: Urology;  Laterality: Right;  . TOTAL HIP ARTHROPLASTY Bilateral 2012  and 2009      Home Medications    Prior to Admission medications   Medication Sig Start Date End Date Taking? Authorizing Provider  cephALEXin (KEFLEX) 500 MG capsule Take 1 capsule (500 mg total) by mouth 2 (two) times daily. X 3 days to prevent post-op infection with stent in place. 04/12/16  Yes Sebastian Acheheodore Manny, MD  loratadine (CLARITIN) 10 MG tablet Take 10 mg by mouth every morning.    Yes Historical Provider, MD  oxyCODONE-acetaminophen (ROXICET) 5-325 MG tablet Take 1-2 tablets by mouth every 6 (six) hours as needed for severe pain (post-operatively). 04/12/16  Yes Sebastian Acheheodore Manny, MD  senna-docusate (SENOKOT-S) 8.6-50 MG tablet Take 1 tablet by mouth 2 (two) times daily. While taking strong pain meds to prevent constipation. Patient taking differently: Take 2 tablets by mouth daily. While taking strong pain meds to prevent constipation. 04/05/16  Yes Sebastian Acheheodore Manny, MD    Family History No family history on file.  Social History Social History  Substance Use Topics  . Smoking status: Current Every Day Smoker    Packs/day: 2.00    Years: 30.00    Types: Cigarettes  . Smokeless tobacco: Never Used  . Alcohol  use No    Allergies   Review of patient's allergies indicates no known allergies.   Review of Systems Review of Systems  All other systems reviewed and are negative.   Physical Exam Updated Vital Signs BP 136/100   Pulse 81   Temp 98.7 F (37.1 C) (Oral)   Resp 18   SpO2 97%   Physical Exam  Constitutional: He is oriented to person, place, and time. He appears well-developed and well-nourished.    HENT:  Head: Normocephalic and atraumatic.  Eyes: Conjunctivae are normal. Pupils are equal, round, and reactive to light. Right eye exhibits no discharge. Left eye exhibits no discharge. No scleral icterus.  Neck: Normal range of motion. No JVD present. No tracheal deviation present.  Pulmonary/Chest: Effort normal. No stridor.  Abdominal: He exhibits no distension and no mass. There is no tenderness. There is no rebound and no guarding. No hernia.  No CVA tenderness  Neurological: He is alert and oriented to person, place, and time. Coordination normal.  Skin: Skin is warm and dry.  Psychiatric: He has a normal mood and affect. His behavior is normal. Judgment and thought content normal.  Nursing note and vitals reviewed.   ED Treatments / Results  Labs (all labs ordered are listed, but only abnormal results are displayed) Labs Reviewed  URINALYSIS, ROUTINE W REFLEX MICROSCOPIC (NOT [facility]) - Abnormal; Notable for the following:       Result Value   Color, Urine RED (*)    APPearance CLOUDY (*)    Hgb urine dipstick LARGE (*)    Bilirubin Urine SMALL (*)    Protein, ur >300 (*)    Leukocytes, UA SMALL (*)    All other components within normal limits  CBC WITH DIFFERENTIAL/PLATELET - Abnormal; Notable for the following:    WBC 12.7 (*)    Neutro Abs 8.5 (*)    All other components within normal limits  BASIC METABOLIC PANEL - Abnormal; Notable for the following:    Glucose, Bld 112 (*)    All other components within normal limits  URINE MICROSCOPIC-ADD ON - Abnormal; Notable for the following:    Casts HYALINE CASTS (*)    All other components within normal limits    EKG  EKG Interpretation None       Radiology No results found.  Procedures Procedures (including critical care time)  Medications Ordered in ED Medications  fentaNYL (SUBLIMAZE) injection 50 mcg (50 mcg Intravenous Given 04/14/16 2158)  ketorolac (TORADOL) 15 MG/ML injection 15 mg (15 mg  Intravenous Given 04/14/16 2208)  HYDROmorphone (DILAUDID) injection 1 mg (1 mg Intravenous Given 04/14/16 2218)    Initial Impression / Assessment and Plan / ED Course  I have reviewed the triage vital signs and the nursing notes.  Pertinent labs & imaging results that were available during my care of the patient were reviewed by me and considered in my medical decision making (see chart for details).  Clinical Course    Final Clinical Impressions(s) / ED Diagnoses   Final diagnoses:  Flank pain   Labs:   Imaging:  Consults:  Therapeutics:  Discharge Meds:   Assessment/Plan:   Patient presents with flank pain status post stent removal. Patient treated here in the ED with good improvement in his symptoms. No concerning findings on exam, patient will be given follow-up with urology, strict return precautions, he verbalizes understanding and agreement today's plan had no further questions or concerns     New Prescriptions Discharge  Medication List as of 04/15/2016 12:34 AM       Eyvonne Mechanic, PA-C 04/15/16 1610    Gwyneth Sprout, MD 04/15/16 9604

## 2016-04-15 NOTE — Discharge Instructions (Signed)
Please follow-up with Dr. Berneice HeinrichManny inform him of today's visit and all relevant data. Please return to emergency room if you experience any new or worsening signs or symptoms

## 2018-05-07 ENCOUNTER — Emergency Department (HOSPITAL_COMMUNITY)
Admission: EM | Admit: 2018-05-07 | Discharge: 2018-05-07 | Disposition: A | Discharge status: Home / Self Care | Payer: Medicaid Other | Attending: Emergency Medicine | Admitting: Emergency Medicine

## 2018-05-07 ENCOUNTER — Encounter (HOSPITAL_COMMUNITY): Payer: Self-pay | Admitting: Emergency Medicine

## 2018-05-07 ENCOUNTER — Emergency Department (HOSPITAL_COMMUNITY): Payer: Medicaid Other

## 2018-05-07 DIAGNOSIS — M25512 Pain in left shoulder: Secondary | ICD-10-CM | POA: Diagnosis not present

## 2018-05-07 DIAGNOSIS — R51 Headache: Secondary | ICD-10-CM | POA: Diagnosis not present

## 2018-05-07 DIAGNOSIS — K118 Other diseases of salivary glands: Secondary | ICD-10-CM | POA: Insufficient documentation

## 2018-05-07 DIAGNOSIS — M79642 Pain in left hand: Secondary | ICD-10-CM | POA: Diagnosis present

## 2018-05-07 DIAGNOSIS — J449 Chronic obstructive pulmonary disease, unspecified: Secondary | ICD-10-CM | POA: Insufficient documentation

## 2018-05-07 DIAGNOSIS — M2550 Pain in unspecified joint: Secondary | ICD-10-CM

## 2018-05-07 DIAGNOSIS — F17210 Nicotine dependence, cigarettes, uncomplicated: Secondary | ICD-10-CM | POA: Insufficient documentation

## 2018-05-07 LAB — CBC WITH DIFFERENTIAL/PLATELET
Basophils Absolute: 0 10*3/uL (ref 0.0–0.1)
Basophils Relative: 0 %
EOS ABS: 0.1 10*3/uL (ref 0.0–0.7)
EOS PCT: 1 %
HCT: 44.5 % (ref 39.0–52.0)
Hemoglobin: 15.3 g/dL (ref 13.0–17.0)
LYMPHS ABS: 2.7 10*3/uL (ref 0.7–4.0)
LYMPHS PCT: 31 %
MCH: 30.2 pg (ref 26.0–34.0)
MCHC: 34.4 g/dL (ref 30.0–36.0)
MCV: 87.9 fL (ref 78.0–100.0)
MONO ABS: 0.6 10*3/uL (ref 0.1–1.0)
MONOS PCT: 7 %
Neutro Abs: 5.3 10*3/uL (ref 1.7–7.7)
Neutrophils Relative %: 61 %
PLATELETS: 424 10*3/uL — AB (ref 150–400)
RBC: 5.06 MIL/uL (ref 4.22–5.81)
RDW: 13 % (ref 11.5–15.5)
WBC: 8.7 10*3/uL (ref 4.0–10.5)

## 2018-05-07 LAB — BASIC METABOLIC PANEL
Anion gap: 11 (ref 5–15)
BUN: 11 mg/dL (ref 6–20)
CO2: 27 mmol/L (ref 22–32)
CREATININE: 1.04 mg/dL (ref 0.61–1.24)
Calcium: 9.9 mg/dL (ref 8.9–10.3)
Chloride: 104 mmol/L (ref 98–111)
GFR calc Af Amer: >60 mL/min (ref >60)
Glucose, Bld: 104 mg/dL — ABNORMAL HIGH (ref 70–99)
POTASSIUM: 4.2 mmol/L (ref 3.5–5.1)
SODIUM: 142 mmol/L (ref 135–145)

## 2018-05-07 MED ORDER — IOPAMIDOL (ISOVUE-370) INJECTION 76%
INTRAVENOUS | Status: AC
  Administered 2018-05-07: 100 mL
  Filled 2018-05-07: qty 100

## 2018-05-07 MED ORDER — IOPAMIDOL (ISOVUE-370) INJECTION 76%: 80.0000 mL | Freq: Once | INTRAVENOUS | Status: DC | PRN

## 2018-05-07 NOTE — ED Triage Notes (Signed)
Pt c/o left shoulder pain, headache and left hand swelling for three months but got worse during the night last night. Denies any new falls or injuries.

## 2018-05-07 NOTE — Discharge Instructions (Addendum)
Follow-up at the [facility] wellness center above.  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  Discuss arthralgias.  Continue Tylenol and Motrin for pain.

## 2018-05-07 NOTE — ED Provider Notes (Signed)
[facility]-EMERGENCY DEPT Provider Note   CSN: [id number] Arrival date & time: 05/07/18  4098     History   Chief Complaint Chief Complaint  Patient presents with  . Shoulder Pain  . Headache  . hand swelling    HPI Tyrone Harris is a 52 y.o. male.  The history is provided by the patient.  Hand Pain  This is a chronic problem. The current episode started more than 1 week ago. The problem occurs daily. The problem has been gradually worsening. Associated symptoms include headaches. Pertinent negatives include no chest pain, no abdominal pain and no shortness of breath. Associated symptoms comments: Left shoulder pain, neck pain, intermittent swelling of joints. Nothing aggravates the symptoms. The symptoms are relieved by NSAIDs. He has tried acetaminophen for the symptoms. The treatment provided mild relief.    Past Medical History:  Diagnosis Date  . COPD (chronic obstructive pulmonary disease) (HCC)   . History of Bell's palsy    2001--age 50  right side  . History of CVA (cerebrovascular accident)    2002-- age 89--  no residual's  . History of kidney stones   . Right ureteral stone     Patient Active Problem List   Diagnosis Date Noted  . Ureteral stone 04/04/2016    Past Surgical History:  Procedure Laterality Date  . CYSTOSCOPY W/ URETERAL STENT PLACEMENT Right 04/04/2016   Procedure: CYSTOSCOPY WITH RETROGRADE PYELOGRAM/URETERAL STENT PLACEMENT;  Surgeon: Sebastian Ache, MD;  Location: [facility];  Service: Urology;  Laterality: Right;  . CYSTOSCOPY WITH RETROGRADE PYELOGRAM, URETEROSCOPY AND STENT PLACEMENT Right 04/12/2016   Procedure: CYSTOSCOPY WITH RETROGRADE PYELOGRAM, URETEROSCOPY AND STENT EXCHANGE;  Surgeon: Sebastian Ache, MD;  Location: [facility];  Service: Urology;  Laterality: Right;  . KNEE ARTHROSCOPY Right 2009  . STONE EXTRACTION WITH BASKET Right 04/12/2016   Procedure: STONE EXTRACTION WITH BASKET;  Surgeon:  Sebastian Ache, MD;  Location: [facility];  Service: Urology;  Laterality: Right;  . TOTAL HIP ARTHROPLASTY Bilateral 2012  and 2009        Home Medications    Prior to Admission medications   Medication Sig Start Date End Date Taking? Authorizing Provider  Multiple Vitamins-Minerals (MULTIVITAMIN MEN 50+ PO) Take 1 tablet by mouth daily.   Yes [provider]  Omega-3 Fatty Acids (FISH OIL) 1000 MG CAPS Take 1,000 mg by mouth daily.   Yes [provider]  loratadine (CLARITIN) 10 MG tablet Take 10 mg by mouth daily as needed for allergies.     [provider]  senna-docusate (SENOKOT-S) 8.6-50 MG tablet Take 1 tablet by mouth 2 (two) times daily. While taking strong pain meds to prevent constipation. Patient not taking: Reported on 05/07/2018 04/05/16   Sebastian Ache, MD    Family History No family history on file.  Social History Social History   Tobacco Use  . Smoking status: Current Every Day Smoker    Packs/day: 2.00    Years: 30.00    Pack years: 60.00    Types: Cigarettes  . Smokeless tobacco: Never Used  Substance Use Topics  . Alcohol use: No  . Drug use: Yes    Frequency: 1.0 times per week    Types: Marijuana     Allergies   Patient has no known allergies.   Review of Systems Review of Systems  Constitutional: Negative for chills and fever.  HENT: Negative for ear pain and sore throat.   Eyes: Negative  for pain and visual disturbance.  Respiratory: Negative for cough and shortness of breath.   Cardiovascular: Negative for chest pain and palpitations.  Gastrointestinal: Negative for abdominal pain and vomiting.  Genitourinary: Negative for dysuria and hematuria.  Musculoskeletal: Positive for arthralgias, joint swelling and neck pain. Negative for back pain.  Skin: Negative for color change and rash.  Neurological: Positive for headaches. Negative for seizures and syncope.  All other systems reviewed and are  negative.    Physical Exam Updated Vital Signs  ED Triage Vitals  Enc Vitals Group     BP 05/07/18 0948 (!) 148/101     Pulse Rate 05/07/18 0948 92     Resp 05/07/18 0948 20     Temp 05/07/18 0948 97.6 F (36.4 C)     Temp Source 05/07/18 0948 Oral     SpO2 05/07/18 0948 100 %     Weight 05/07/18 0949 200 lb (90.7 kg)     Height 05/07/18 0949 5\' 10"  (1.778 m)     Head Circumference --      Peak Flow --      Pain Score 05/07/18 0947 8     Pain Loc --      Pain Edu? --      Excl. in GC? --     Physical Exam  Constitutional: He is oriented to person, place, and time. He appears well-developed and well-nourished. No distress.  HENT:  Head: Normocephalic and atraumatic.  Mouth/Throat: Oropharynx is clear and moist.  Eyes: Pupils are equal, round, and reactive to light. Conjunctivae and EOM are normal.  Neck: Normal range of motion. Neck supple.  No midline spinal tenderness  Cardiovascular: Normal rate, regular rhythm, normal heart sounds and intact distal pulses.  No murmur heard. Pulmonary/Chest: Effort normal and breath sounds normal. No respiratory distress. He has no wheezes.  Abdominal: Soft. Bowel sounds are normal. There is no tenderness.  Musculoskeletal: Normal range of motion. He exhibits edema (mild swelling of left hand). He exhibits no tenderness.  Neurological: He is alert and oriented to person, place, and time. He has normal strength.  5+/5 strength throughout, normal sensation, no drift, chronic right sided facial droop  Skin: Skin is warm and dry. Capillary refill takes less than 2 seconds.  Psychiatric: He has a normal mood and affect.  Nursing note and vitals reviewed.    ED Treatments / Results  Labs (all labs ordered are listed, but only abnormal results are displayed) Labs Reviewed  CBC WITH DIFFERENTIAL/PLATELET - Abnormal; Notable for the following components:      Result Value   Platelets 424 (*)    All other components within normal limits    BASIC METABOLIC PANEL - Abnormal; Notable for the following components:   Glucose, Bld 104 (*)    All other components within normal limits  ANTINUCLEAR ANTIBODIES, IFA    EKG None  Radiology Ct Angio Head W Or Wo Contrast  Result Date: 05/07/2018 CLINICAL DATA:  Headache.  Smoker. EXAM: CT ANGIOGRAPHY HEAD AND NECK TECHNIQUE: Multidetector CT imaging of the head and neck was performed using the standard protocol during bolus administration of intravenous contrast. Multiplanar CT image reconstructions and MIPs were obtained to evaluate the vascular anatomy. Carotid stenosis measurements (when applicable) are obtained utilizing NASCET criteria, using the distal internal carotid diameter as the denominator. CONTRAST:  100 mL Isovue 370 COMPARISON:  Head CT 11/03/2010 FINDINGS: CT HEAD FINDINGS Brain: There is no evidence of acute infarct, intracranial hemorrhage, mass, midline  shift, or extra-axial fluid collection. Cerebral volume is unchanged with chronic asymmetric enlargement of the right lateral ventricle noted. Vascular: Evaluated below. Skull: No fracture or focal osseous lesion. Sinuses: Mild bilateral paranasal sinus mucosal thickening without sinus fluid. Clear mastoid air cells. Orbits: Unremarkable. Review of the MIP images confirms the above findings CTA NECK FINDINGS Aortic arch: Normal variant aortic arch branching pattern with common origin of the brachiocephalic and left common carotid arteries. Widely patent arch vessel origins. Right carotid system: Patent with minimal atherosclerotic plaque about the carotid bifurcation and in the mid common carotid artery. No evidence of significant stenosis or dissection Left carotid system: These results were communicated to Dr. Wilford Corner at 12:12 pm on 05/07/2018 by text page via the [facility] messaging system. Vertebral arteries: Patent and codominant without evidence of stenosis or dissection. Mild nonstenotic plaque at the left vertebral artery origin.  Skeleton: C6-7 disc degeneration with disc bulging and uncovertebral spurring resulting in moderate bilateral neural foraminal stenosis and possibly mild spinal stenosis. Other neck: Enhancing soft tissue nodules within and or adjacent to the parotid glands measure 12 mm and 10 mm on the right and range from 7-9 mm on the left, demonstrating greater enhancement than other regional lymph nodes. Borderline enlarged level IIa lymph nodes measure 10 mm in short axis bilaterally. Upper chest: Mild paraseptal emphysema in the lung apices. Review of the MIP images confirms the above findings CTA HEAD FINDINGS Anterior circulation: The internal carotid arteries are patent from skull base to carotid termini with bilateral siphon atherosclerosis resulting in at most mild cavernous stenosis on the right. ACAs and MCAs are patent without evidence of proximal branch occlusion or significant proximal stenosis. The right A1 segment is mildly hypoplastic. No aneurysm is identified. Posterior circulation: The intracranial vertebral arteries are widely patent to the basilar. Patent PICA and SCA origins are identified bilaterally. The basilar artery is widely patent. Posterior communicating arteries are not identified and may be diminutive or absent. PCAs are patent without evidence of flow limiting proximal stenosis. There is mild proximal right P2 narrowing. No aneurysm is identified. Venous sinuses: Patent. Anatomic variants: None of significance. Delayed phase: No abnormal enhancement. Review of the MIP images confirms the above findings CLINICAL DATA:  Headache. Smoker. EXAM: CT ANGIOGRAPHY HEAD AND NECK TECHNIQUE: Multidetector CT imaging of the head and neck was performed using the standard protocol during bolus administration of intravenous contrast. Multiplanar CT image reconstructions and MIPs were obtained to evaluate the vascular anatomy. Carotid stenosis measurements (when applicable) are obtained utilizing NASCET  criteria, using the distal internal carotid diameter as the denominator. CONTRAST:  100 mL Isovue 370 COMPARISON:  Head CT 11/03/2010 FINDINGS: CT HEAD FINDINGS Brain: There is no evidence of acute infarct, intracranial hemorrhage, mass, midline shift, or extra-axial fluid collection. Cerebral volume is unchanged with chronic asymmetric enlargement of the right lateral ventricle noted. Vascular: Evaluated below. Skull: No fracture or focal osseous lesion. Sinuses: Mild bilateral paranasal sinus mucosal thickening without sinus fluid. Clear mastoid air cells. Orbits: Unremarkable. Review of the MIP images confirms the above findings CTA NECK FINDINGS Aortic arch: Normal variant aortic arch branching pattern with common origin of the brachiocephalic and left common carotid arteries. Widely patent arch vessel origins. Right carotid system: Patent with minimal atherosclerotic plaque about the carotid bifurcation and in the mid common carotid artery. No evidence of significant stenosis or dissection Left carotid system: Patent and codominant without evidence of stenosis or dissection. Mild nonstenotic plaque at the left vertebral artery  origin. Vertebral arteries: Patent and codominant without evidence of stenosis or dissection. Mild nonstenotic plaque at the left vertebral artery origin. Skeleton: C6-7 disc degeneration with disc bulging and uncovertebral spurring resulting in moderate bilateral neural foraminal stenosis and possibly mild spinal stenosis. Other neck: Enhancing soft tissue nodules within and adjacent to the parotid glands measure 12 mm and 10 mm on the right and range from 7-9 mm on the left, demonstrating greater enhancement than other regional lymph nodes. Borderline enlarged level IIa lymph nodes measure 10 mm in short axis bilaterally. Upper chest: Mild paraseptal emphysema in the lung apices. Review of the MIP images confirms the above findings CTA HEAD FINDINGS Anterior circulation: The internal  carotid arteries are patent from skull base to carotid termini with bilateral siphon atherosclerosis resulting in at most mild cavernous stenosis on the right. ACAs and MCAs are patent without evidence of proximal branch occlusion or significant proximal stenosis. The right A1 segment is mildly hypoplastic. No aneurysm is identified. Posterior circulation: The intracranial vertebral arteries are widely patent to the basilar. Patent PICA and SCA origins are identified bilaterally. The basilar artery is widely patent. Posterior communicating arteries are not identified and may be diminutive or absent. PCAs are patent without evidence of flow limiting proximal stenosis. There is mild proximal right P2 narrowing. No aneurysm is identified. Venous sinuses: Patent. Anatomic variants: None of significance. Delayed phase: No abnormal enhancement. Review of the MIP images confirms the above findings IMPRESSION: 1. Mild intracranial and cervical atherosclerosis without major vessel occlusion, significant stenosis, or aneurysm. 2. No evidence of acute intracranial abnormality. 3. Small bilateral parotid masses which may reflect a combination of primary salivary neoplasm (specifically Warthin's tumors given multiplicity and smoking history) and lymph nodes. 4. C6-7 disc degeneration with bilateral neural foraminal stenosis and potential spinal stenosis. Electronically Signed   By: Sebastian Ache M.D.   On: 05/07/2018 12:36   Ct Angio Neck W And/or Wo Contrast  Result Date: 05/07/2018 CLINICAL DATA:  Headache.  Smoker. EXAM: CT ANGIOGRAPHY HEAD AND NECK TECHNIQUE: Multidetector CT imaging of the head and neck was performed using the standard protocol during bolus administration of intravenous contrast. Multiplanar CT image reconstructions and MIPs were obtained to evaluate the vascular anatomy. Carotid stenosis measurements (when applicable) are obtained utilizing NASCET criteria, using the distal internal carotid diameter as  the denominator. CONTRAST:  100 mL Isovue 370 COMPARISON:  Head CT 11/03/2010 FINDINGS: CT HEAD FINDINGS Brain: There is no evidence of acute infarct, intracranial hemorrhage, mass, midline shift, or extra-axial fluid collection. Cerebral volume is unchanged with chronic asymmetric enlargement of the right lateral ventricle noted. Vascular: Evaluated below. Skull: No fracture or focal osseous lesion. Sinuses: Mild bilateral paranasal sinus mucosal thickening without sinus fluid. Clear mastoid air cells. Orbits: Unremarkable. Review of the MIP images confirms the above findings CTA NECK FINDINGS Aortic arch: Normal variant aortic arch branching pattern with common origin of the brachiocephalic and left common carotid arteries. Widely patent arch vessel origins. Right carotid system: Patent with minimal atherosclerotic plaque about the carotid bifurcation and in the mid common carotid artery. No evidence of significant stenosis or dissection Left carotid system: These results were communicated to Dr. Wilford Corner at 12:12 pm on 05/07/2018 by text page via the [facility] messaging system. Vertebral arteries: Patent and codominant without evidence of stenosis or dissection. Mild nonstenotic plaque at the left vertebral artery origin. Skeleton: C6-7 disc degeneration with disc bulging and uncovertebral spurring resulting in moderate bilateral neural foraminal stenosis and possibly  mild spinal stenosis. Other neck: Enhancing soft tissue nodules within and or adjacent to the parotid glands measure 12 mm and 10 mm on the right and range from 7-9 mm on the left, demonstrating greater enhancement than other regional lymph nodes. Borderline enlarged level IIa lymph nodes measure 10 mm in short axis bilaterally. Upper chest: Mild paraseptal emphysema in the lung apices. Review of the MIP images confirms the above findings CTA HEAD FINDINGS Anterior circulation: The internal carotid arteries are patent from skull base to carotid termini with  bilateral siphon atherosclerosis resulting in at most mild cavernous stenosis on the right. ACAs and MCAs are patent without evidence of proximal branch occlusion or significant proximal stenosis. The right A1 segment is mildly hypoplastic. No aneurysm is identified. Posterior circulation: The intracranial vertebral arteries are widely patent to the basilar. Patent PICA and SCA origins are identified bilaterally. The basilar artery is widely patent. Posterior communicating arteries are not identified and may be diminutive or absent. PCAs are patent without evidence of flow limiting proximal stenosis. There is mild proximal right P2 narrowing. No aneurysm is identified. Venous sinuses: Patent. Anatomic variants: None of significance. Delayed phase: No abnormal enhancement. Review of the MIP images confirms the above findings CLINICAL DATA:  Headache. Smoker. EXAM: CT ANGIOGRAPHY HEAD AND NECK TECHNIQUE: Multidetector CT imaging of the head and neck was performed using the standard protocol during bolus administration of intravenous contrast. Multiplanar CT image reconstructions and MIPs were obtained to evaluate the vascular anatomy. Carotid stenosis measurements (when applicable) are obtained utilizing NASCET criteria, using the distal internal carotid diameter as the denominator. CONTRAST:  100 mL Isovue 370 COMPARISON:  Head CT 11/03/2010 FINDINGS: CT HEAD FINDINGS Brain: There is no evidence of acute infarct, intracranial hemorrhage, mass, midline shift, or extra-axial fluid collection. Cerebral volume is unchanged with chronic asymmetric enlargement of the right lateral ventricle noted. Vascular: Evaluated below. Skull: No fracture or focal osseous lesion. Sinuses: Mild bilateral paranasal sinus mucosal thickening without sinus fluid. Clear mastoid air cells. Orbits: Unremarkable. Review of the MIP images confirms the above findings CTA NECK FINDINGS Aortic arch: Normal variant aortic arch branching pattern with  common origin of the brachiocephalic and left common carotid arteries. Widely patent arch vessel origins. Right carotid system: Patent with minimal atherosclerotic plaque about the carotid bifurcation and in the mid common carotid artery. No evidence of significant stenosis or dissection Left carotid system: Patent and codominant without evidence of stenosis or dissection. Mild nonstenotic plaque at the left vertebral artery origin. Vertebral arteries: Patent and codominant without evidence of stenosis or dissection. Mild nonstenotic plaque at the left vertebral artery origin. Skeleton: C6-7 disc degeneration with disc bulging and uncovertebral spurring resulting in moderate bilateral neural foraminal stenosis and possibly mild spinal stenosis. Other neck: Enhancing soft tissue nodules within and adjacent to the parotid glands measure 12 mm and 10 mm on the right and range from 7-9 mm on the left, demonstrating greater enhancement than other regional lymph nodes. Borderline enlarged level IIa lymph nodes measure 10 mm in short axis bilaterally. Upper chest: Mild paraseptal emphysema in the lung apices. Review of the MIP images confirms the above findings CTA HEAD FINDINGS Anterior circulation: The internal carotid arteries are patent from skull base to carotid termini with bilateral siphon atherosclerosis resulting in at most mild cavernous stenosis on the right. ACAs and MCAs are patent without evidence of proximal branch occlusion or significant proximal stenosis. The right A1 segment is mildly hypoplastic. No aneurysm is identified.  Posterior circulation: The intracranial vertebral arteries are widely patent to the basilar. Patent PICA and SCA origins are identified bilaterally. The basilar artery is widely patent. Posterior communicating arteries are not identified and may be diminutive or absent. PCAs are patent without evidence of flow limiting proximal stenosis. There is mild proximal right P2 narrowing. No  aneurysm is identified. Venous sinuses: Patent. Anatomic variants: None of significance. Delayed phase: No abnormal enhancement. Review of the MIP images confirms the above findings IMPRESSION: 1. Mild intracranial and cervical atherosclerosis without major vessel occlusion, significant stenosis, or aneurysm. 2. No evidence of acute intracranial abnormality. 3. Small bilateral parotid masses which may reflect a combination of primary salivary neoplasm (specifically Warthin's tumors given multiplicity and smoking history) and lymph nodes. 4. C6-7 disc degeneration with bilateral neural foraminal stenosis and potential spinal stenosis. Electronically Signed   By: Sebastian Ache M.D.   On: 05/07/2018 12:36    Procedures Procedures (including critical care time)  Medications Ordered in ED Medications  iopamidol (ISOVUE-370) 76 % injection 80 mL (has no administration in time range)  iopamidol (ISOVUE-370) 76 % injection (100 mLs  Contrast Given 05/07/18 1143)     Initial Impression / Assessment and Plan / ED Course  I have reviewed the triage vital signs and the nursing notes.  Pertinent labs & imaging results that were available during my care of the patient were reviewed by me and considered in my medical decision making (see chart for details).     Tyrone Harris is a 52 year old male with history of CVA, COPD, Bell's palsy with chronic right-sided facial weakness who presents to the ED with left shoulder pain, left sided upper extremity arthralgias, left hand swelling.  Patient with intermittent symptoms for the last several months.  Has polyarthritis over the last several months including left hand, left elbow, right elbow, right hand, lower extremities.  States that he has had some worsening hand pain, shoulder pain, neck pain for the last several days.  No weakness, sensation changes.  No new neurological findings.  Normal speech.  Recently was diagnosed with Lyme disease and underwent treatment  with doxycycline.  Patient has some swelling to the left hand but no infectious findings.  No warmth, no erythema.  Patient has some tenderness around the left shoulder area.  No obvious midline spinal tenderness.  Neurovascularly intact on exam.  Normal neurological exam.  Some lower right-sided facial weakness which she states is chronic. No concern for stroke. Suspect ongoing chronic process.  Patient had CBC and BMP drawn that were overall unremarkable.  No significant electrolyte abnormality, kidney injury, leukocytosis.  Patient had CTA of the head and neck performed that showed no acute arterial process. Overall unremarkable.  Patient had incidental finding of bilateral parotid masses.  Overall, patient does not have any signs to suggest ischemic limb or central process.  Patient with polyarthritis concern for possible rheumatological process therefore an ANA was sent off for.  Possible sequelae of Lyme disease but less likely.  Has completed treatment.  Patient with incidental bilateral parotid masses which I believe are not related however will give referral to ENT for further work-up.  Given information for follow-up with primary care doctor to perform further investigation of patient's symptoms.  Recommend Tylenol Motrin and discharged from ED in good condition.  Given return precautions.  Final Clinical Impressions(s) / ED Diagnoses   Final diagnoses:  Arthralgia, unspecified joint  Parotid mass    ED Discharge Orders    None  Virgina Norfolk, DO 05/07/18 1314

## 2018-05-08 LAB — ANTINUCLEAR ANTIBODIES, IFA: ANA Ab, IFA: NEGATIVE

## 2021-01-07 ENCOUNTER — Encounter (HOSPITAL_COMMUNITY): Payer: Self-pay | Admitting: Emergency Medicine

## 2021-01-07 ENCOUNTER — Other Ambulatory Visit: Payer: Self-pay

## 2021-01-07 ENCOUNTER — Emergency Department (HOSPITAL_COMMUNITY): Payer: Medicaid Other

## 2021-01-07 ENCOUNTER — Emergency Department (HOSPITAL_COMMUNITY)
Admission: EM | Admit: 2021-01-07 | Discharge: 2021-01-07 | Disposition: A | Discharge status: Home / Self Care | Payer: Medicaid Other | Attending: Emergency Medicine | Admitting: Emergency Medicine

## 2021-01-07 DIAGNOSIS — L539 Erythematous condition, unspecified: Secondary | ICD-10-CM | POA: Diagnosis not present

## 2021-01-07 DIAGNOSIS — J449 Chronic obstructive pulmonary disease, unspecified: Secondary | ICD-10-CM | POA: Diagnosis not present

## 2021-01-07 DIAGNOSIS — F17210 Nicotine dependence, cigarettes, uncomplicated: Secondary | ICD-10-CM | POA: Insufficient documentation

## 2021-01-07 DIAGNOSIS — R55 Syncope and collapse: Secondary | ICD-10-CM | POA: Diagnosis not present

## 2021-01-07 DIAGNOSIS — I10 Essential (primary) hypertension: Secondary | ICD-10-CM | POA: Diagnosis not present

## 2021-01-07 DIAGNOSIS — Z20822 Contact with and (suspected) exposure to covid-19: Secondary | ICD-10-CM | POA: Diagnosis not present

## 2021-01-07 DIAGNOSIS — R42 Dizziness and giddiness: Secondary | ICD-10-CM | POA: Insufficient documentation

## 2021-01-07 DIAGNOSIS — H538 Other visual disturbances: Secondary | ICD-10-CM | POA: Diagnosis not present

## 2021-01-07 HISTORY — DX: Personal history of other infectious and parasitic diseases: Z86.19

## 2021-01-07 LAB — COMPREHENSIVE METABOLIC PANEL
ALT: 16 U/L (ref 0–44)
AST: 16 U/L (ref 15–41)
Albumin: 4.1 g/dL (ref 3.5–5.0)
Alkaline Phosphatase: 75 U/L (ref 38–126)
Anion gap: 8 (ref 5–15)
BUN: 12 mg/dL (ref 6–20)
CO2: 25 mmol/L (ref 22–32)
Calcium: 9.6 mg/dL (ref 8.9–10.3)
Chloride: 105 mmol/L (ref 98–111)
Creatinine, Ser: 1.2 mg/dL (ref 0.61–1.24)
GFR, Estimated: >60 mL/min (ref >60)
Glucose, Bld: 103 mg/dL — ABNORMAL HIGH (ref 70–99)
Potassium: 3.9 mmol/L (ref 3.5–5.1)
Sodium: 138 mmol/L (ref 135–145)
Total Bilirubin: 0.7 mg/dL (ref 0.3–1.2)
Total Protein: 7.4 g/dL (ref 6.5–8.1)

## 2021-01-07 LAB — CBC
HCT: 47.1 % (ref 39.0–52.0)
Hemoglobin: 15.1 g/dL (ref 13.0–17.0)
MCH: 29.3 pg (ref 26.0–34.0)
MCHC: 32.1 g/dL (ref 30.0–36.0)
MCV: 91.3 fL (ref 80.0–100.0)
Platelets: 338 10*3/uL (ref 150–400)
RBC: 5.16 MIL/uL (ref 4.22–5.81)
RDW: 12.9 % (ref 11.5–15.5)
WBC: 8.4 10*3/uL (ref 4.0–10.5)
nRBC: 0 % (ref 0.0–0.2)

## 2021-01-07 LAB — RESP PANEL BY RT-PCR (FLU A&B, COVID) ARPGX2
Influenza A by PCR: NEGATIVE
Influenza B by PCR: NEGATIVE
SARS Coronavirus 2 by RT PCR: NEGATIVE

## 2021-01-07 MED ORDER — SODIUM CHLORIDE 0.9 % IV BOLUS
1000.0000 mL | Freq: Once | INTRAVENOUS | Status: AC
  Administered 2021-01-07: 1000 mL via INTRAVENOUS

## 2021-01-07 NOTE — ED Notes (Signed)
Reviewed discharge instructions with patient. Follow-up care reviewed. Patient verbalized understanding. Patient A&Ox4, VSS, and ambulatory with steady gait upon discharge.  

## 2021-01-07 NOTE — ED Provider Notes (Signed)
[facility] EMERGENCY DEPARTMENT Provider Note   CSN: [id number] Arrival date & time:        History Chief Complaint  Patient presents with  . Blurred Vision  . Dizziness    Tyrone Harris is a 55 y.o. male.  HPI 55 year old male history of COPD, stroke, Lyme disease, smoker, hypertension, presents today with episode of feeling like he had tunnel vision and lightheadedness at work today.  He states he felt like he was going to pass out.  Patient was actually at his wife's place of business.  He sat down in a chair.  On the way here he began having a headache that was 8 out of 10 back of his head.  This is spontaneously reduced from an 8 to a 2 out of 10.  Other symptoms have improved.  He denies any current vision changes, visual field cuts, lateralized weakness, chest pain, dyspnea, nausea, vomiting.  Symptoms are currently resolved.    Past Medical History:  Diagnosis Date  . COPD (chronic obstructive pulmonary disease) (HCC)   . History of Bell's palsy    2001--age 8  right side  . History of CVA (cerebrovascular accident)    2002-- age 89--  no residual's  . History of kidney stones   . History of Lyme disease    3-4 years ago  . Right ureteral stone     Patient Active Problem List   Diagnosis Date Noted  . Ureteral stone 04/04/2016    Past Surgical History:  Procedure Laterality Date  . CYSTOSCOPY W/ URETERAL STENT PLACEMENT Right 04/04/2016   Procedure: CYSTOSCOPY WITH RETROGRADE PYELOGRAM/URETERAL STENT PLACEMENT;  Surgeon: Sebastian Ache, MD;  Location: [facility];  Service: Urology;  Laterality: Right;  . CYSTOSCOPY WITH RETROGRADE PYELOGRAM, URETEROSCOPY AND STENT PLACEMENT Right 04/12/2016   Procedure: CYSTOSCOPY WITH RETROGRADE PYELOGRAM, URETEROSCOPY AND STENT EXCHANGE;  Surgeon: Sebastian Ache, MD;  Location: [facility];  Service: Urology;  Laterality: Right;  . KNEE ARTHROSCOPY Right 2009  . STONE EXTRACTION WITH BASKET Right  04/12/2016   Procedure: STONE EXTRACTION WITH BASKET;  Surgeon: Sebastian Ache, MD;  Location: [facility];  Service: Urology;  Laterality: Right;  . TOTAL HIP ARTHROPLASTY Bilateral 2012  and 2009       History reviewed. No pertinent family history.  Social History   Tobacco Use  . Smoking status: Current Every Day Smoker    Packs/day: 2.00    Years: 30.00    Pack years: 60.00    Types: Cigarettes  . Smokeless tobacco: Never Used  Vaping Use  . Vaping Use: Never used  Substance Use Topics  . Alcohol use: No  . Drug use: Yes    Frequency: 1.0 times per week    Types: Marijuana    Home Medications Prior to Admission medications   Medication Sig Start Date End Date Taking? Authorizing Provider  loratadine (CLARITIN) 10 MG tablet Take 10 mg by mouth daily as needed for allergies.     [provider]  Multiple Vitamins-Minerals (MULTIVITAMIN MEN 50+ PO) Take 1 tablet by mouth daily.    [provider]  Omega-3 Fatty Acids (FISH OIL) 1000 MG CAPS Take 1,000 mg by mouth daily.    [provider]  senna-docusate (SENOKOT-S) 8.6-50 MG tablet Take 1 tablet by mouth 2 (two) times daily. While taking strong pain meds to prevent constipation. Patient not taking: Reported on 05/07/2018 04/05/16   Sebastian Ache, MD    Allergies  Patient has no known allergies.  Review of Systems   Review of Systems  Physical Exam Updated Vital Signs BP (!) 168/106 (BP Location: Right Arm)   Pulse 94   Temp 98.3 F (36.8 C) (Oral)   Resp 12   Ht 1.778 m (5\' 10" )   Wt 90.7 kg   SpO2 98%   BMI 28.69 kg/m   Physical Exam Vitals and nursing note reviewed.  Constitutional:      Appearance: He is well-developed.  HENT:     Head: Normocephalic and atraumatic.     Right Ear: External ear normal.     Left Ear: External ear normal.     Nose: Nose normal.  Eyes:     Conjunctiva/sclera: Conjunctivae normal.     Pupils: Pupils are equal, round, and  reactive to light.  Cardiovascular:     Rate and Rhythm: Normal rate and regular rhythm.     Heart sounds: Normal heart sounds.  Pulmonary:     Effort: Pulmonary effort is normal. No respiratory distress.     Breath sounds: Normal breath sounds. No wheezing.  Chest:     Chest wall: No tenderness.  Abdominal:     General: Bowel sounds are normal. There is no distension.     Palpations: Abdomen is soft. There is no mass.     Tenderness: There is no abdominal tenderness. There is no guarding.  Musculoskeletal:        General: Normal range of motion.     Cervical back: Normal range of motion and neck supple.  Skin:    General: Skin is warm and dry.     Findings: Rash present.     Comments: Mild diffuse erythematous annular lesions  Neurological:     Mental Status: He is alert and oriented to person, place, and time.     Motor: No abnormal muscle tone.     Coordination: Coordination normal.     Deep Tendon Reflexes: Reflexes are normal and symmetric.  Psychiatric:        Behavior: Behavior normal.        Thought Content: Thought content normal.        Judgment: Judgment normal.     ED Results / Procedures / Treatments   Labs (all labs ordered are listed, but only abnormal results are displayed) Labs Reviewed - No data to display  EKG EKG Interpretation  Date/Time:  Friday Jan 07 2021 11:14:55 EDT Ventricular Rate:  86 PR Interval:  151 QRS Duration: 80 QT Interval:  347 QTC Calculation: 415 R Axis:   76 Text Interpretation: Sinus rhythm RSR' in V1 or V2, right VCD or RVH Confirmed by [birth year]1981 [phone]) on 01/07/2021 3:24:21 PM   Radiology CT Head Wo Contrast  Result Date: 01/07/2021 CLINICAL DATA:  Headache and dizziness. EXAM: CT HEAD WITHOUT CONTRAST TECHNIQUE: Contiguous axial images were obtained from the base of the skull through the vertex without intravenous contrast. COMPARISON:  CT a head dated May 07, 2018. FINDINGS: Brain: No evidence of acute  infarction, hemorrhage, hydrocephalus, extra-axial collection or mass lesion/mass effect. Stable mild atrophy with asymmetric enlargement of the right lateral ventricle. Vascular: Calcified atherosclerosis at the skullbase. No hyperdense vessel. Skull: Normal. Negative for fracture or focal lesion. Sinuses/Orbits: No acute finding. Progressive bilateral paranasal sinus mucosal thickening. The mastoid air cells are clear. Other: None. IMPRESSION: 1. No acute intracranial abnormality. 2. Progressive paranasal sinus disease. Electronically Signed   By: May 09, 2018 M.D.   On: 01/07/2021  13:39   DG Chest Port 1 View  Result Date: 01/07/2021 CLINICAL DATA:  Hypertension.  Blurry vision and dizziness. EXAM: PORTABLE CHEST 1 VIEW COMPARISON:  PA and lateral chest 02/04/2020. FINDINGS: Lungs clear. Heart size normal. No pneumothorax or pleural fluid. No acute or focal bony abnormality. IMPRESSION: Negative chest. Electronically Signed   By: Drusilla Kanner M.D.   On: 01/07/2021 12:28    Procedures Procedures   Medications Ordered in ED Medications - No data to display  ED Course  I have reviewed the triage vital signs and the nursing notes.  Pertinent labs & imaging results that were available during my care of the patient were reviewed by me and considered in my medical decision making (see chart for details).    MDM Rules/Calculators/A&P                          55 year old male history of COPD and Lyme disease who presents today with an episode of lightheadedness and near syncope.  He described feeling very lightheaded and feeling like his vision was Into a tunnel.  He evaluation EKG, labs, CT head and chest x-Lenor Provencher within normal limits with the exception of some sinusitis noted on head CT.  Patient endorses he has had symptoms a couple weeks ago but these are improving.  Patient slightly hypertensive.  Otherwise vital signs within normal limits.  He has been observed for several hours and  appears to be stable.  I discussed return precautions and need for follow-up and patient voices understanding.  Final Clinical Impression(s) / ED Diagnoses Final diagnoses:  Near syncope    Rx / DC Orders ED Discharge Orders    None       Margarita Grizzle, MD 01/07/21 1525

## 2021-01-07 NOTE — ED Triage Notes (Signed)
BIB [facility] after wife called to report pt having blurry vision and dizziness while at work. PT hypertensive on scene.Pt also reports new onset HA. PT has hx of lyme disease, arthritis, stroke.

## 2021-01-07 NOTE — Discharge Instructions (Addendum)
Patient had a near passing out episode today.  Labs, EKG, and CAT scan appear to be within normal limits.  Please drink plenty of fluids and follow-up closely with your primary care doctor.  Need Please return if you are having any worsening symptoms.

## 2022-02-22 IMAGING — CT CT HEAD W/O CM
3 series · 16 of 47 positions shown, 19 images · non-contrast
Comparison: CT a head dated May 07, 2018.

CLINICAL DATA: Headache and dizziness.

EXAM:
CT HEAD WITHOUT CONTRAST
TECHNIQUE: Contiguous axial images were obtained from the base of the skull
through the vertex without intravenous contrast.

[Series 3: head 5.0 h30s · axial · 0.44mm/px · z∈[+957,+1117]mm · 10 of 38 slices shown, 13 images]
[im 3/38  brain]
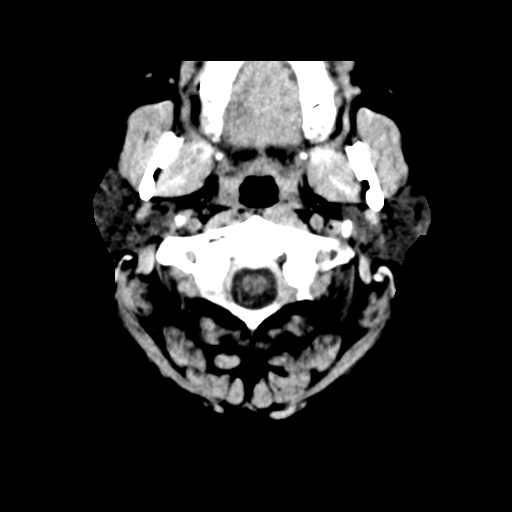
[im 3/38  bone]
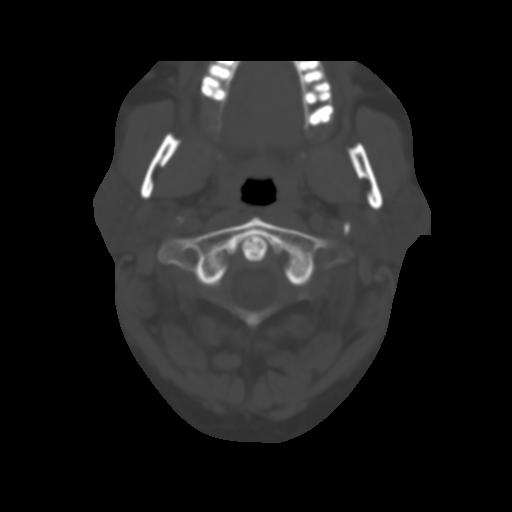
[im 7/38  brain]
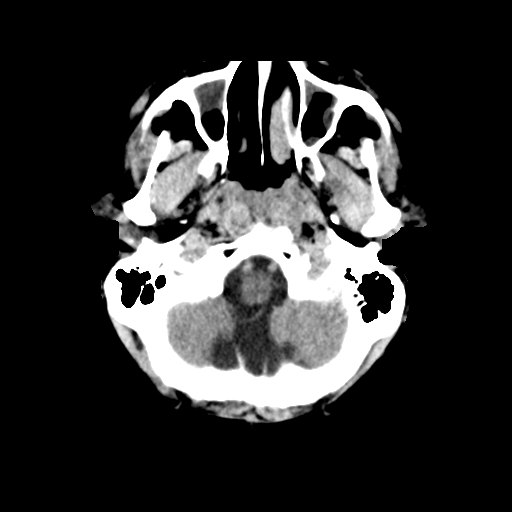
[im 11/38  brain]
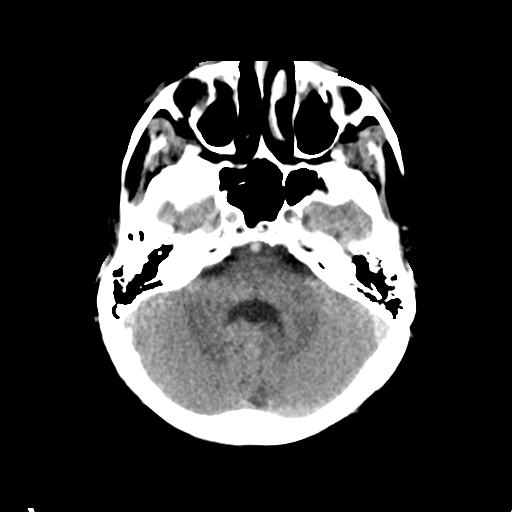
[im 13/38  brain]
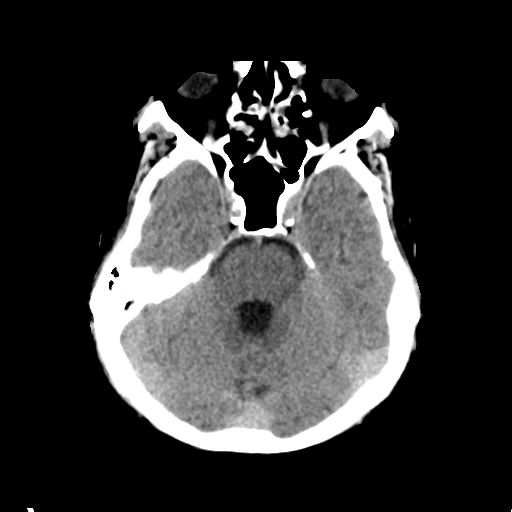
[im 17/38  brain]
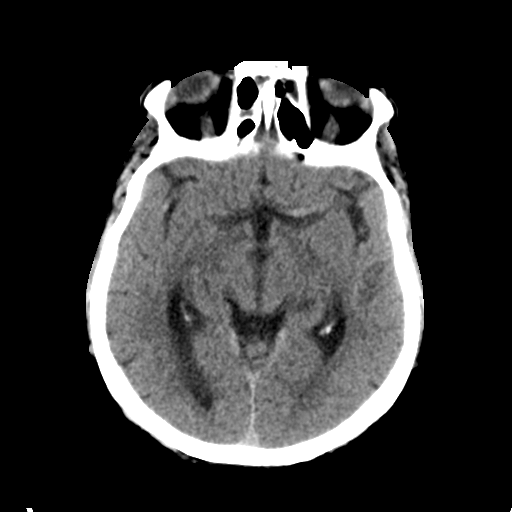
[im 17/38  bone]
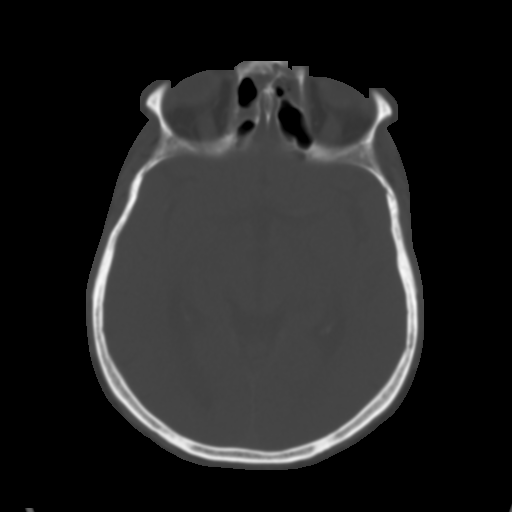
[im 21/38  brain]
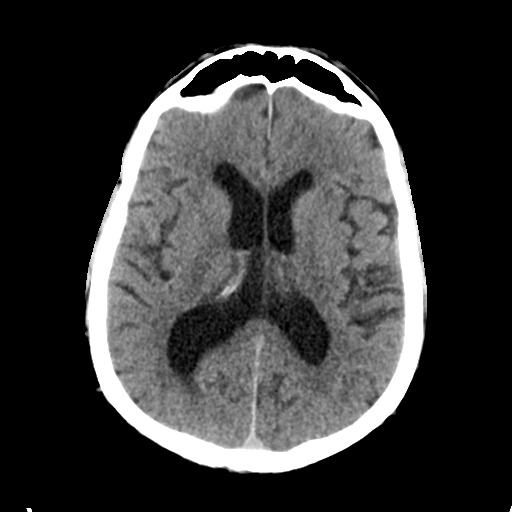
[im 25/38  brain]
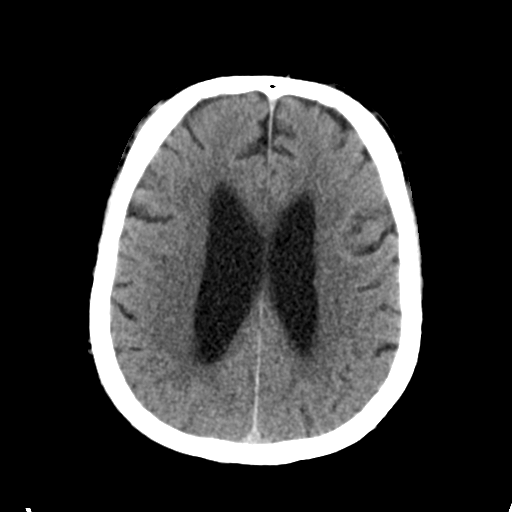
[im 29/38  brain]
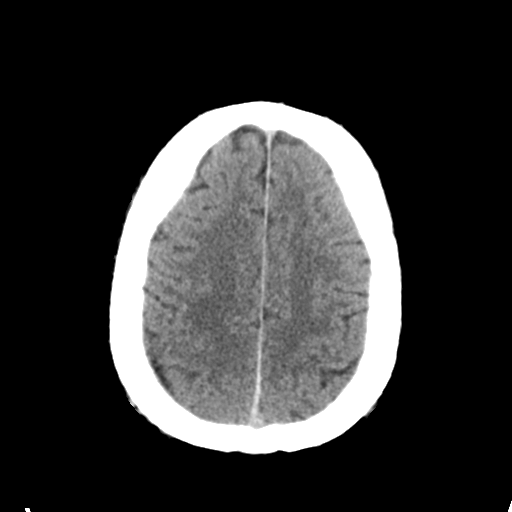
[im 31/38  brain]
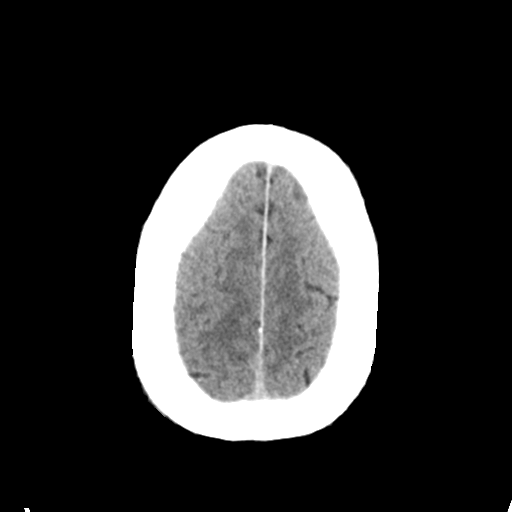
[im 31/38  bone]
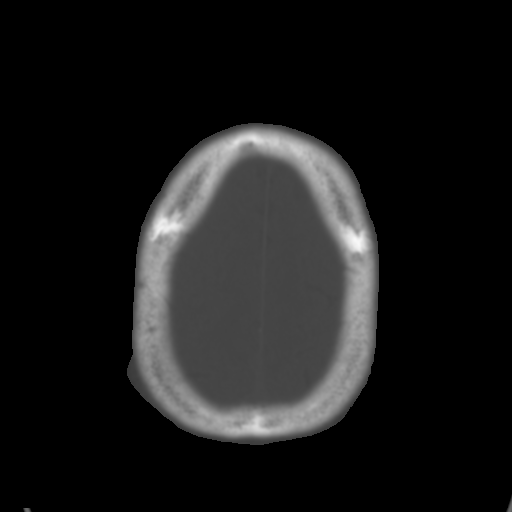
[im 35/38  brain]
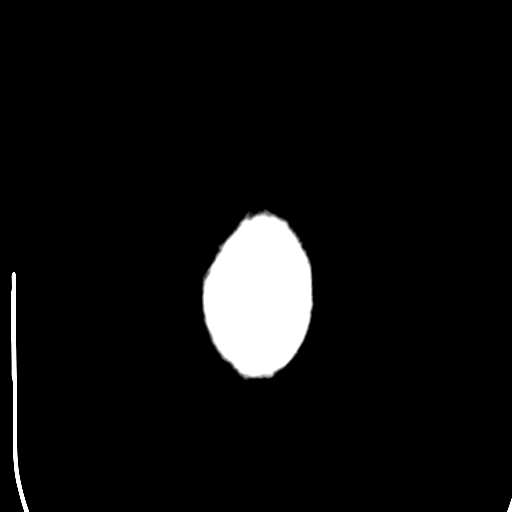

[Series 5: head 3.0 mpr cor · coronal · 0.34mm/px · 3 of 73 slices shown]
[im 25/73  brain]
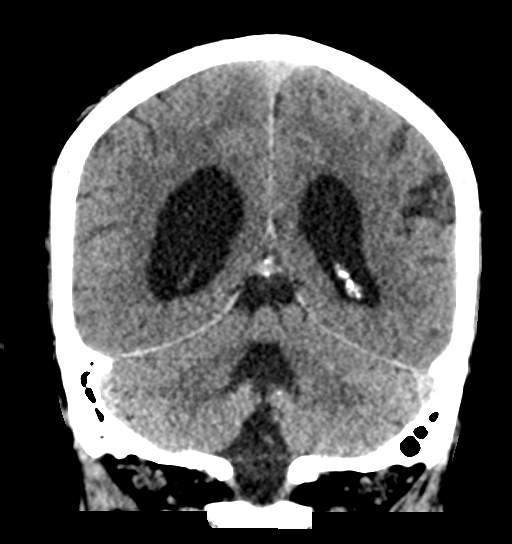
[im 33/73  brain]
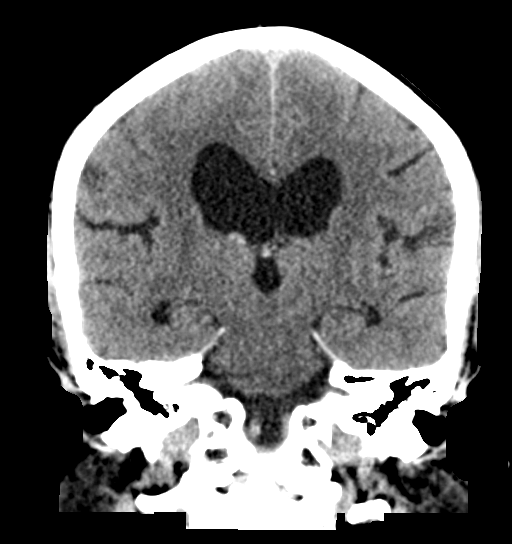
[im 41/73  brain]
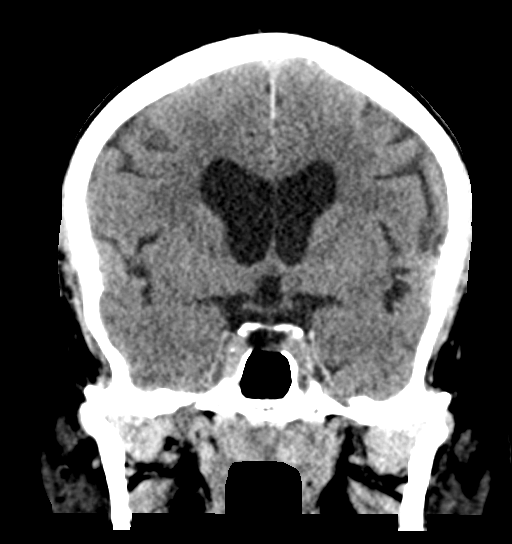

[Series 6: head 3.0 mpr sag · sagittal · 0.36mm/px · 3 of 59 slices shown]
[im 20/59  brain]
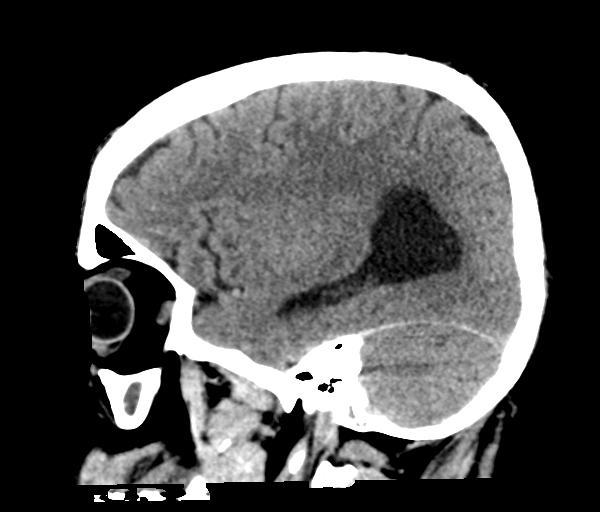
[im 30/59  brain]
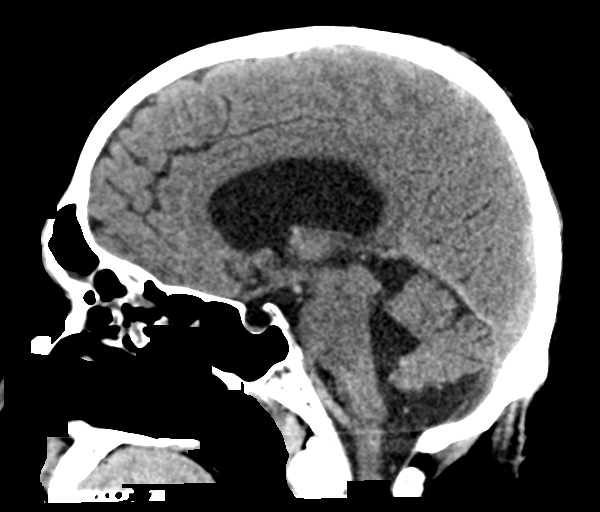
[im 39/59  brain]
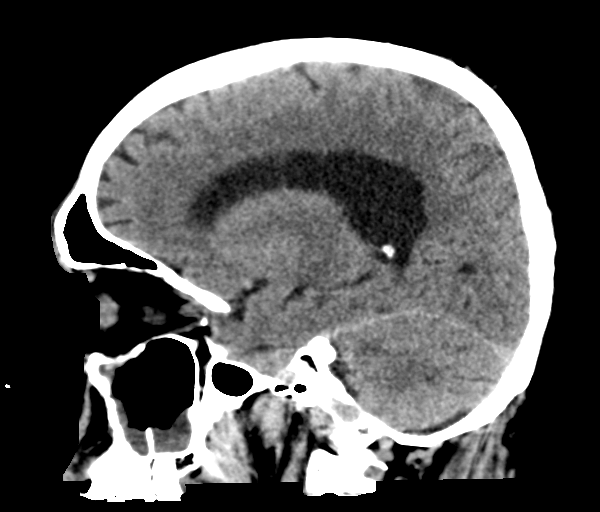

[16 of 47 positions shown; findings below may reference images not displayed]

FINDINGS: Brain: No evidence of acute infarction, hemorrhage, hydrocephalus,
extra-axial collection or mass lesion/mass effect. Stable mild
atrophy with asymmetric enlargement of the right lateral ventricle.

Vascular: Calcified atherosclerosis at the skullbase. No hyperdense
vessel.

Skull: Normal. Negative for fracture or focal lesion.

Sinuses/Orbits: No acute finding. Progressive bilateral paranasal
sinus mucosal thickening. The mastoid air cells are clear.

Other: None.
IMPRESSION: 1. No acute intracranial abnormality.
2. Progressive paranasal sinus disease.
